# Patient Record
Sex: Female | Born: 1945 | Race: White | Hispanic: No | Marital: Married | Smoking: Former smoker
Health system: Southern US, Community
[De-identification: ages and names within clinical notes are randomized; demographics above are authoritative.]

## PROBLEM LIST (undated history)

## (undated) DIAGNOSIS — C801 Malignant (primary) neoplasm, unspecified: Secondary | ICD-10-CM

## (undated) DIAGNOSIS — E782 Mixed hyperlipidemia: Secondary | ICD-10-CM

## (undated) DIAGNOSIS — Z8719 Personal history of other diseases of the digestive system: Secondary | ICD-10-CM

## (undated) DIAGNOSIS — D649 Anemia, unspecified: Secondary | ICD-10-CM

## (undated) DIAGNOSIS — I10 Essential (primary) hypertension: Secondary | ICD-10-CM

## (undated) DIAGNOSIS — K219 Gastro-esophageal reflux disease without esophagitis: Secondary | ICD-10-CM

## (undated) DIAGNOSIS — M1990 Unspecified osteoarthritis, unspecified site: Secondary | ICD-10-CM

## (undated) HISTORY — PX: ESOPHAGOGASTRODUODENOSCOPY: SHX1529

## (undated) HISTORY — PX: APPENDECTOMY: SHX54

## (undated) HISTORY — PX: TUBAL LIGATION: SHX77

## (undated) HISTORY — PX: OOPHORECTOMY: SHX86

## (undated) HISTORY — PX: ABDOMINAL HYSTERECTOMY: SHX81

## (undated) HISTORY — PX: COLONOSCOPY: SHX174

## (undated) HISTORY — PX: CATARACT EXTRACTION: SUR2

---

## 2019-12-20 ENCOUNTER — Ambulatory Visit: Payer: Self-pay | Attending: Internal Medicine

## 2019-12-20 DIAGNOSIS — Z23 Encounter for immunization: Secondary | ICD-10-CM | POA: Insufficient documentation

## 2019-12-20 NOTE — Progress Notes (Signed)
   Covid-19 Vaccination Clinic  Name:  Amber Farrell    MRN: [id number] DOB: [birth year]  12/20/2019  Amber Farrell was observed post Covid-19 immunization for 15 minutes without incidence. She was provided with Vaccine Information Sheet and instruction to access the V-Safe system.   Amber Farrell was instructed to call 911 with any severe reactions post vaccine: Marland Kitchen Difficulty breathing  . Swelling of your face and throat  . A fast heartbeat  . A bad rash all over your body  . Dizziness and weakness    Immunizations Administered    Name Date Dose VIS Date Route   Moderna COVID-19 Vaccine 12/20/2019 10:51 AM 0.5 mL 09/28/2019 Intramuscular   Manufacturer: Moderna   Lot: [id number]   [place]:[id number]

## 2020-01-18 ENCOUNTER — Ambulatory Visit: Payer: Medicare PPO | Attending: Internal Medicine

## 2020-01-18 DIAGNOSIS — Z23 Encounter for immunization: Secondary | ICD-10-CM

## 2020-01-18 NOTE — Progress Notes (Signed)
   Covid-19 Vaccination Clinic  Name:  Amber Farrell    MRN: [id number] DOB: 08/01/1946  01/18/2020  Ms. Amber Farrell was observed post Covid-19 immunization for 15 minutes without incident. She was provided with Vaccine Information Sheet and instruction to access the V-Safe system.   Ms. Amber Farrell was instructed to call 911 with any severe reactions post vaccine: Marland Kitchen Difficulty breathing  . Swelling of face and throat  . A fast heartbeat  . A bad rash all over body  . Dizziness and weakness   Immunizations Administered    Name Date Dose VIS Date Route   Moderna COVID-19 Vaccine 01/18/2020 10:10 AM 0.5 mL 09/28/2019 Intramuscular   Manufacturer: Levan Hurst   LotUT:[id number]   [place]:[id number]

## 2020-07-26 ENCOUNTER — Other Ambulatory Visit: Payer: Medicare PPO

## 2020-07-26 DIAGNOSIS — Z20822 Contact with and (suspected) exposure to covid-19: Secondary | ICD-10-CM

## 2020-07-27 LAB — SARS-COV-2, NAA 2 DAY TAT

## 2020-07-27 LAB — NOVEL CORONAVIRUS, NAA: SARS-CoV-2, NAA: NOT DETECTED

## 2020-09-19 ENCOUNTER — Other Ambulatory Visit: Payer: Self-pay | Admitting: Orthopedic Surgery

## 2020-09-19 ENCOUNTER — Encounter: Payer: Self-pay | Admitting: Orthopedic Surgery

## 2020-09-19 NOTE — H&P (Signed)
NAME: Amber Farrell MRN:   [id number] DOB:   01-May-1946     HISTORY AND PHYSICAL  CHIEF COMPLAINT:  Left hip pain  HISTORY:   Amber Farrell a 74 y.o. female  with left  Hip Pain Patient complains of left hip pain. Onset of the symptoms was several years ago. Inciting event: none. The patient reports the hip pain is worse with weight bearing and is aggravated by walking. Associated symptoms: none. Aggravating symptoms include: any weight bearing and going up and down stairs. Patient has had prior hip problems. Previous visits for this problem: yes, last seen several weeks ago by me. Evaluation to date: plain films, which were abnormal  osteoarthritis. Treatment to date: OTC analgesics, which have been somewhat effective, prescription analgesics, which have been somewhat effective, home exercise program, which has been somewhat effective and physical therapy, which has been somewhat effective. Plan for left total hip replacement  PAST MEDICAL HISTORY:  No past medical history on file.  PAST SURGICAL HISTORY:    MEDICATIONS:  (Not in a hospital admission)   ALLERGIES:  No Known Allergies  REVIEW OF SYSTEMS:   Negative except HPI  FAMILY HISTORY:  No family history on file.  SOCIAL HISTORY:   has no history on file for tobacco use, alcohol use, and drug use.  PHYSICAL EXAM:  General appearance: alert, cooperative and no distress Resp: clear to auscultation bilaterally Cardio: regular rate and rhythm, S1, S2 normal, no murmur, click, rub or gallop GI: soft, non-tender; bowel sounds normal; no masses,  no organomegaly Extremities: extremities normal, atraumatic, no cyanosis or edema and Homans sign is negative, no sign of DVT Pulses: 2+ and symmetric Skin: Skin color, texture, turgor normal. No rashes or lesions Neurologic: Alert and oriented X 3, normal strength and tone. Normal symmetric reflexes. Normal coordination and gait    LABORATORY STUDIES: No results for input(s): WBC, HGB, HCT,  PLT in the last 72 hours.  No results for input(s): NA, K, CL, CO2, GLUCOSE, BUN, CREATININE, CALCIUM in the last 72 hours.  STUDIES/RESULTS:  No results found.  ASSESSMENT:  End stage osteoarthritis left hip        Active Problems:   * No active hospital problems. *    PLAN:  Left Primary Total Hip   Carlynn Spry 09/19/2020. 11:44 AM

## 2020-09-20 ENCOUNTER — Encounter
Admission: RE | Admit: 2020-09-20 | Discharge: 2020-09-20 | Disposition: A | Discharge status: Home / Self Care | Payer: Medicare PPO | Source: Ambulatory Visit | Attending: Orthopedic Surgery | Admitting: Orthopedic Surgery

## 2020-09-20 ENCOUNTER — Other Ambulatory Visit: Payer: Self-pay

## 2020-09-20 DIAGNOSIS — Z01818 Encounter for other preprocedural examination: Secondary | ICD-10-CM | POA: Insufficient documentation

## 2020-09-20 DIAGNOSIS — I10 Essential (primary) hypertension: Secondary | ICD-10-CM | POA: Diagnosis not present

## 2020-09-20 DIAGNOSIS — Z01810 Encounter for preprocedural cardiovascular examination: Secondary | ICD-10-CM

## 2020-09-20 HISTORY — DX: Gastro-esophageal reflux disease without esophagitis: K21.9

## 2020-09-20 HISTORY — DX: Personal history of other diseases of the digestive system: Z87.19

## 2020-09-20 HISTORY — DX: Malignant (primary) neoplasm, unspecified: C80.1

## 2020-09-20 HISTORY — DX: Anemia, unspecified: D64.9

## 2020-09-20 HISTORY — DX: Essential (primary) hypertension: I10

## 2020-09-20 LAB — BASIC METABOLIC PANEL
Anion gap: 12 (ref 5–15)
BUN: 14 mg/dL (ref 8–23)
CO2: 29 mmol/L (ref 22–32)
Calcium: 9.9 mg/dL (ref 8.9–10.3)
Chloride: 96 mmol/L — ABNORMAL LOW (ref 98–111)
Creatinine, Ser: 0.72 mg/dL (ref 0.44–1.00)
GFR, Estimated: >60 mL/min (ref >60)
Glucose, Bld: 90 mg/dL (ref 70–99)
Potassium: 3.3 mmol/L — ABNORMAL LOW (ref 3.5–5.1)
Sodium: 137 mmol/L (ref 135–145)

## 2020-09-20 LAB — CBC
HCT: 40.7 % (ref 36.0–46.0)
Hemoglobin: 13.3 g/dL (ref 12.0–15.0)
MCH: 30.9 pg (ref 26.0–34.0)
MCHC: 32.7 g/dL (ref 30.0–36.0)
MCV: 94.4 fL (ref 80.0–100.0)
Platelets: 363 10*3/uL (ref 150–400)
RBC: 4.31 MIL/uL (ref 3.87–5.11)
RDW: 14.1 % (ref 11.5–15.5)
WBC: 4.5 10*3/uL (ref 4.0–10.5)
nRBC: 0 % (ref 0.0–0.2)

## 2020-09-20 LAB — TYPE AND SCREEN
ABO/RH(D): A POS
Antibody Screen: NEGATIVE

## 2020-09-20 LAB — URINALYSIS, ROUTINE W REFLEX MICROSCOPIC
Bilirubin Urine: NEGATIVE
Glucose, UA: NEGATIVE mg/dL
Hgb urine dipstick: NEGATIVE
Ketones, ur: NEGATIVE mg/dL
Leukocytes,Ua: NEGATIVE
Nitrite: NEGATIVE
Protein, ur: NEGATIVE mg/dL
Specific Gravity, Urine: 1.006 (ref 1.005–1.030)
pH: 8 (ref 5.0–8.0)

## 2020-09-20 LAB — SURGICAL PCR SCREEN
MRSA, PCR: NEGATIVE
Staphylococcus aureus: NEGATIVE

## 2020-09-20 LAB — APTT: aPTT: 30 seconds (ref 24–36)

## 2020-09-20 LAB — PROTIME-INR
INR: 1 (ref 0.8–1.2)
Prothrombin Time: 12.4 seconds (ref 11.4–15.2)

## 2020-09-20 NOTE — Patient Instructions (Addendum)
Your procedure is scheduled on: 09/27/20 Report to [facility] AFTER CHECKING IN AT [facility]. To find out your arrival time please call [phone] between 1PM - 3PM on 09/26/20.  Remember: Instructions that are not followed completely may result in serious medical risk, up to and including death, or upon the discretion of your surgeon and anesthesiologist your surgery may need to be rescheduled.     _X__ 1. Do not eat food after midnight the night before your procedure.                 No gum chewing or hard candies. You may drink clear liquids up to 2 hours                 before you are scheduled to arrive for your surgery- DO not drink clear                 liquids within 2 hours of the start of your surgery.                 Clear Liquids include:  water, apple juice without pulp, clear carbohydrate                 drink such as Clearfast or Gatorade, Black Coffee or Tea (Do not add                 anything to coffee or tea). Diabetics water only  __X__2.  On the morning of surgery brush your teeth with toothpaste and water, you                 may rinse your mouth with mouthwash if you wish.  Do not swallow any              toothpaste of mouthwash.     _X__ 3.  No Alcohol for 24 hours before or after surgery.   _X__ 4.  Do Not Smoke or use e-cigarettes For 24 Hours Prior to Your Surgery.                 Do not use any chewable tobacco products for at least 6 hours prior to                 surgery.  ____  5.  Bring all medications with you on the day of surgery if instructed.   __X__  6.  Notify your doctor if there is any change in your medical condition      (cold, fever, infections).     Do not wear jewelry, make-up, hairpins, clips or nail polish. Do not wear lotions, powders, or perfumes.  Do not shave 48 hours prior to surgery. Men may shave face and neck. Do not bring valuables to the hospital.    [facility]  is not responsible for any belongings or valuables.  Contacts, dentures/partials or body piercings may not be worn into surgery. Bring a case for your contacts, glasses or hearing aids, a denture cup will be supplied. Leave your suitcase in the car. After surgery it may be brought to your room. For patients admitted to the hospital, discharge time is determined by your treatment team.   Patients discharged the day of surgery will not be allowed to drive home.   Please read over the following fact sheets that you were given:   MRSA Information  __X__ Take these medicines the morning of surgery  with A SIP OF WATER:    1. loratadine (CLARITIN) 10 MG tablet  2. gabapentin (NEURONTIN) 600 MG capsule  3. omeprazole (PRILOSEC) 20 MG capsule  4.  5.  6.  ____ Fleet Enema (as directed)   __X__ Use CHG Soap/SAGE wipes as directed  ____ Use inhalers on the day of surgery  ____ Stop metformin/Janumet/Farxiga 2 days prior to surgery    ____ Take 1/2 of usual insulin dose the night before surgery. No insulin the morning          of surgery.   ____ Stop Blood Thinners Coumadin/Plavix/Xarelto/Pleta/Pradaxa/Eliquis/Effient/Aspirin  on   Or contact your Surgeon, Cardiologist or Medical Doctor regarding  ability to stop your blood thinners  __X__ Stop Anti-inflammatories 7 days before surgery such as Advil, Ibuprofen, Motrin,  BC or Goodies Powder, Naprosyn, Naproxen, Aleve, Aspirin   OK TO USE TYLENOL  __X__ Stop all herbal supplements, fish oil or vitamin E until after surgery.  STOP RE;IEF FACTOR TODAY 09/20/20  ____ Bring C-Pap to the hospital.     How to Use an Incentive Spirometer An incentive spirometer is a tool that measures how well you are filling your lungs with each breath. Learning to take long, deep breaths using this tool can help you keep your lungs clear and active. This may help to reverse or lessen your chance of developing breathing (pulmonary) problems, especially  infection. You may be asked to use a spirometer:  After a surgery.  If you have a lung problem or a history of smoking.  After a long period of time when you have been unable to move or be active. If the spirometer includes an indicator to show the highest number that you have reached, your health care provider or respiratory therapist will help you set a goal. Keep a list (log) of your progress as told by your health care provider. What are the risks?  Breathing too quickly may cause dizziness or cause you to pass out. Take your time so you do not get dizzy or light-headed.  If you are in pain, you may need to take pain medicine before doing incentive spirometry. It is harder to take a deep breath if you are having pain. How to use your incentive spirometer  1. Sit up on the edge of your bed or on a chair. 2. Hold the incentive spirometer so that it is in an upright position. 3. Before you use the spirometer, breathe out normally. 4. Place the mouthpiece in your mouth. Make sure your lips are closed tightly around it. 5. Breathe in slowly and as deeply as you can through your mouth, causing the piston or the ball to rise toward the top of the chamber. 6. Hold your breath for 3-5 seconds, or for as long as possible. ? If the spirometer includes a coach indicator, use this to guide you in breathing. Slow down your breathing if the indicator goes above the marked areas. 7. Remove the mouthpiece from your mouth and breathe out normally. The piston or ball will return to the bottom of the chamber. 8. Rest for a few seconds, then repeat the steps 10 or more times. ? Take your time and take a few normal breaths between deep breaths so that you do not get dizzy or light-headed. ? Do this every 1-2 hours when you are awake. 9. If the spirometer includes a goal marker to show the highest number you have reached (best effort), use this as a goal to  work toward during each repetition. 10. After each  set of 10 deep breaths, cough a few times. This will help to make sure that your lungs are clear. ? If you have an incision on your chest or abdomen from surgery, place a pillow or a rolled-up towel firmly against the incision when you cough. This can help to reduce pain from coughing. General tips  When you become able to get out of bed, walk around often and continue to cough to help clear your lungs.  Keep using the incentive spirometer until your health care provider says it is okay to stop using it. If you have been in the hospital, you may be told to keep using the spirometer at home. Contact a health care provider if:  You are having difficulty using the spirometer.  You have trouble using the spirometer as often as instructed.  Your pain medicine is not giving enough relief for you to use the spirometer as told.  You have a fever.  You develop shortness of breath. Get help right away if:  You develop a cough with bloody mucus from the lungs (bloody sputum).  You have fluid or blood coming from an incision site after you cough. Summary  An incentive spirometer is a tool that can help you learn to take long, deep breaths to keep your lungs clear and active.  You may be asked to use a spirometer after a surgery, if you have a lung problem or a history of smoking, or if you have been inactive for a long period of time.  Use your incentive spirometer as instructed every 1-2 hours while you are awake.  If you have an incision on your chest or abdomen, place a pillow or a rolled-up towel firmly against your incision when you cough. This will help to reduce pain. This information is not intended to replace advice given to you by your health care provider. Make sure you discuss any questions you have with your health care provider. Document Revised: 05/14/2019 Document Reviewed: 08/27/2017 Elsevier Patient Education  2020 [employer].

## 2020-09-25 ENCOUNTER — Other Ambulatory Visit
Admission: RE | Admit: 2020-09-25 | Discharge: 2020-09-25 | Disposition: A | Discharge status: Home / Self Care | Payer: Medicare PPO | Source: Ambulatory Visit | Attending: Orthopedic Surgery | Admitting: Orthopedic Surgery

## 2020-09-25 ENCOUNTER — Other Ambulatory Visit: Payer: Self-pay

## 2020-09-25 DIAGNOSIS — Z01812 Encounter for preprocedural laboratory examination: Secondary | ICD-10-CM | POA: Diagnosis present

## 2020-09-25 DIAGNOSIS — Z20822 Contact with and (suspected) exposure to covid-19: Secondary | ICD-10-CM | POA: Insufficient documentation

## 2020-09-25 LAB — SARS CORONAVIRUS 2 (TAT 6-24 HRS): SARS Coronavirus 2: NEGATIVE

## 2020-09-26 MED ORDER — LACTATED RINGERS IV SOLN: INTRAVENOUS | Status: DC

## 2020-09-26 MED ORDER — TRANEXAMIC ACID-NACL 1000-0.7 MG/100ML-% IV SOLN
1000.0000 mg | INTRAVENOUS | Status: AC
  Administered 2020-09-27: 1000 mg via INTRAVENOUS

## 2020-09-26 MED ORDER — CEFAZOLIN SODIUM-DEXTROSE 2-4 GM/100ML-% IV SOLN
2.0000 g | INTRAVENOUS | Status: AC
  Administered 2020-09-27: 2 g via INTRAVENOUS

## 2020-09-26 MED ORDER — CHLORHEXIDINE GLUCONATE 0.12 % MT SOLN: 15.0000 mL | Freq: Once | OROMUCOSAL | Status: AC

## 2020-09-26 MED ORDER — ORAL CARE MOUTH RINSE: 15.0000 mL | Freq: Once | OROMUCOSAL | Status: AC

## 2020-09-27 ENCOUNTER — Ambulatory Visit: Payer: Medicare PPO | Admitting: Certified Registered"

## 2020-09-27 ENCOUNTER — Other Ambulatory Visit: Payer: Self-pay

## 2020-09-27 ENCOUNTER — Ambulatory Visit: Payer: Medicare PPO

## 2020-09-27 ENCOUNTER — Encounter
Admission: RE | Disposition: A | Discharge status: Home / Self Care | Payer: Self-pay | Source: Home / Self Care | Attending: Orthopedic Surgery

## 2020-09-27 ENCOUNTER — Observation Stay
Admission: RE | Admit: 2020-09-27 | Discharge: 2020-09-28 | Disposition: A | Discharge status: Home / Self Care | Payer: Medicare PPO | Attending: Orthopedic Surgery | Admitting: Orthopedic Surgery

## 2020-09-27 ENCOUNTER — Encounter: Payer: Self-pay | Admitting: Orthopedic Surgery

## 2020-09-27 DIAGNOSIS — I10 Essential (primary) hypertension: Secondary | ICD-10-CM | POA: Diagnosis not present

## 2020-09-27 DIAGNOSIS — Z79899 Other long term (current) drug therapy: Secondary | ICD-10-CM | POA: Insufficient documentation

## 2020-09-27 DIAGNOSIS — M1612 Unilateral primary osteoarthritis, left hip: Secondary | ICD-10-CM | POA: Diagnosis present

## 2020-09-27 DIAGNOSIS — Z419 Encounter for procedure for purposes other than remedying health state, unspecified: Secondary | ICD-10-CM

## 2020-09-27 DIAGNOSIS — Z96642 Presence of left artificial hip joint: Secondary | ICD-10-CM

## 2020-09-27 HISTORY — PX: TOTAL HIP ARTHROPLASTY: SHX124

## 2020-09-27 LAB — ABO/RH: ABO/RH(D): A POS

## 2020-09-27 SURGERY — ARTHROPLASTY, HIP, TOTAL, ANTERIOR APPROACH
Anesthesia: Spinal | Site: Hip | Laterality: Left

## 2020-09-27 MED ORDER — PANTOPRAZOLE SODIUM 40 MG PO TBEC
40.0000 mg | DELAYED_RELEASE_TABLET | Freq: Every day | ORAL | Status: DC
  Administered 2020-09-28: 40 mg via ORAL
  Filled 2020-09-27: qty 1

## 2020-09-27 MED ORDER — BUPIVACAINE HCL (PF) 0.5 % IJ SOLN
INTRAMUSCULAR | Status: DC | PRN
  Administered 2020-09-27: 2.5 mL via INTRATHECAL

## 2020-09-27 MED ORDER — HYDROCODONE-ACETAMINOPHEN 5-325 MG PO TABS
1.0000 | ORAL_TABLET | ORAL | Status: DC | PRN
  Administered 2020-09-27 – 2020-09-28 (×2): 1 via ORAL
  Filled 2020-09-27: qty 2
  Filled 2020-09-27 (×2): qty 1

## 2020-09-27 MED ORDER — ONDANSETRON HCL 4 MG PO TABS: 4.0000 mg | ORAL_TABLET | Freq: Four times a day (QID) | ORAL | Status: DC | PRN

## 2020-09-27 MED ORDER — MENTHOL 3 MG MT LOZG
1.0000 | LOZENGE | OROMUCOSAL | Status: DC | PRN
  Filled 2020-09-27: qty 9

## 2020-09-27 MED ORDER — GLYCOPYRROLATE 0.2 MG/ML IJ SOLN
INTRAMUSCULAR | Status: DC | PRN
  Administered 2020-09-27: .2 mg via INTRAVENOUS

## 2020-09-27 MED ORDER — TRANEXAMIC ACID-NACL 1000-0.7 MG/100ML-% IV SOLN
INTRAVENOUS | Status: AC
  Filled 2020-09-27: qty 100

## 2020-09-27 MED ORDER — ONDANSETRON HCL 4 MG/2ML IJ SOLN: 4.0000 mg | Freq: Four times a day (QID) | INTRAMUSCULAR | Status: DC | PRN

## 2020-09-27 MED ORDER — PHENYLEPHRINE HCL (PRESSORS) 10 MG/ML IV SOLN
INTRAVENOUS | Status: DC | PRN
  Administered 2020-09-27 (×3): 100 ug via INTRAVENOUS

## 2020-09-27 MED ORDER — MIDAZOLAM HCL 2 MG/2ML IJ SOLN
INTRAMUSCULAR | Status: AC
  Filled 2020-09-27: qty 2

## 2020-09-27 MED ORDER — HYDROCODONE-ACETAMINOPHEN 7.5-325 MG PO TABS: 1.0000 | ORAL_TABLET | ORAL | Status: DC | PRN

## 2020-09-27 MED ORDER — METOCLOPRAMIDE HCL 5 MG/ML IJ SOLN: 5.0000 mg | Freq: Three times a day (TID) | INTRAMUSCULAR | Status: DC | PRN

## 2020-09-27 MED ORDER — HYDROCHLOROTHIAZIDE 25 MG PO TABS
25.0000 mg | ORAL_TABLET | Freq: Every day | ORAL | Status: DC
  Administered 2020-09-28: 25 mg via ORAL
  Filled 2020-09-27: qty 1

## 2020-09-27 MED ORDER — CHLORHEXIDINE GLUCONATE 0.12 % MT SOLN
OROMUCOSAL | Status: AC
  Administered 2020-09-27: 15 mL via OROMUCOSAL
  Filled 2020-09-27: qty 15

## 2020-09-27 MED ORDER — MIDAZOLAM HCL 5 MG/5ML IJ SOLN
INTRAMUSCULAR | Status: DC | PRN
  Administered 2020-09-27 (×2): 1 mg via INTRAVENOUS

## 2020-09-27 MED ORDER — AMLODIPINE BESYLATE 5 MG PO TABS
2.5000 mg | ORAL_TABLET | Freq: Every evening | ORAL | Status: DC
  Administered 2020-09-27 – 2020-09-28 (×2): 2.5 mg via ORAL
  Filled 2020-09-27 (×2): qty 1

## 2020-09-27 MED ORDER — ACETAMINOPHEN 325 MG PO TABS
325.0000 mg | ORAL_TABLET | Freq: Four times a day (QID) | ORAL | Status: DC | PRN
  Administered 2020-09-28: 325 mg via ORAL

## 2020-09-27 MED ORDER — GLYCOPYRROLATE 0.2 MG/ML IJ SOLN
INTRAMUSCULAR | Status: AC
  Filled 2020-09-27: qty 1

## 2020-09-27 MED ORDER — FENTANYL CITRATE (PF) 100 MCG/2ML IJ SOLN
INTRAMUSCULAR | Status: AC
  Filled 2020-09-27: qty 2

## 2020-09-27 MED ORDER — MAGNESIUM CITRATE PO SOLN
1.0000 | Freq: Once | ORAL | Status: DC | PRN
  Filled 2020-09-27: qty 296

## 2020-09-27 MED ORDER — ASPIRIN 81 MG PO CHEW
81.0000 mg | CHEWABLE_TABLET | Freq: Two times a day (BID) | ORAL | Status: DC
  Administered 2020-09-27 – 2020-09-28 (×3): 81 mg via ORAL
  Filled 2020-09-27 (×2): qty 1

## 2020-09-27 MED ORDER — PROPOFOL 10 MG/ML IV BOLUS
INTRAVENOUS | Status: AC
  Filled 2020-09-27: qty 20

## 2020-09-27 MED ORDER — FENTANYL CITRATE (PF) 100 MCG/2ML IJ SOLN
INTRAMUSCULAR | Status: AC
  Administered 2020-09-27: 25 ug via INTRAVENOUS
  Filled 2020-09-27: qty 2

## 2020-09-27 MED ORDER — PHENYLEPHRINE HCL (PRESSORS) 10 MG/ML IV SOLN: INTRAVENOUS | Status: DC | PRN

## 2020-09-27 MED ORDER — GABAPENTIN 300 MG PO CAPS: 300.0000 mg | ORAL_CAPSULE | ORAL | Status: DC

## 2020-09-27 MED ORDER — BUPIVACAINE HCL (PF) 0.5 % IJ SOLN
INTRAMUSCULAR | Status: AC
  Filled 2020-09-27: qty 10

## 2020-09-27 MED ORDER — POVIDONE-IODINE 10 % EX SWAB: 2.0000 "application " | Freq: Once | CUTANEOUS | Status: DC

## 2020-09-27 MED ORDER — BISACODYL 10 MG RE SUPP: 10.0000 mg | Freq: Every day | RECTAL | Status: DC | PRN

## 2020-09-27 MED ORDER — PHENOL 1.4 % MT LIQD
1.0000 | OROMUCOSAL | Status: DC | PRN
  Filled 2020-09-27: qty 177

## 2020-09-27 MED ORDER — GABAPENTIN 300 MG PO CAPS
300.0000 mg | ORAL_CAPSULE | Freq: Two times a day (BID) | ORAL | Status: DC
  Administered 2020-09-27 – 2020-09-28 (×2): 300 mg via ORAL
  Filled 2020-09-27 (×2): qty 1

## 2020-09-27 MED ORDER — METOCLOPRAMIDE HCL 10 MG PO TABS: 5.0000 mg | ORAL_TABLET | Freq: Three times a day (TID) | ORAL | Status: DC | PRN

## 2020-09-27 MED ORDER — LIDOCAINE HCL (PF) 2 % IJ SOLN
INTRAMUSCULAR | Status: AC
  Filled 2020-09-27: qty 5

## 2020-09-27 MED ORDER — PROPOFOL 500 MG/50ML IV EMUL
INTRAVENOUS | Status: DC | PRN
  Administered 2020-09-27: 100 ug/kg/min via INTRAVENOUS

## 2020-09-27 MED ORDER — ONDANSETRON HCL 4 MG/2ML IJ SOLN: 4.0000 mg | Freq: Once | INTRAMUSCULAR | Status: DC | PRN

## 2020-09-27 MED ORDER — KETOROLAC TROMETHAMINE 15 MG/ML IJ SOLN
7.5000 mg | Freq: Four times a day (QID) | INTRAMUSCULAR | Status: AC
  Administered 2020-09-27 – 2020-09-28 (×2): 7.5 mg via INTRAVENOUS
  Filled 2020-09-27 (×3): qty 1

## 2020-09-27 MED ORDER — LACTATED RINGERS IV SOLN: INTRAVENOUS | Status: DC

## 2020-09-27 MED ORDER — PROPOFOL 500 MG/50ML IV EMUL
INTRAVENOUS | Status: AC
  Filled 2020-09-27: qty 50

## 2020-09-27 MED ORDER — LOSARTAN POTASSIUM 50 MG PO TABS
100.0000 mg | ORAL_TABLET | Freq: Every evening | ORAL | Status: DC
  Administered 2020-09-28: 100 mg via ORAL
  Filled 2020-09-27: qty 2

## 2020-09-27 MED ORDER — PROPOFOL 10 MG/ML IV BOLUS
INTRAVENOUS | Status: DC | PRN
  Administered 2020-09-27: 40 mg via INTRAVENOUS
  Administered 2020-09-27: 20 mg via INTRAVENOUS

## 2020-09-27 MED ORDER — ALUM & MAG HYDROXIDE-SIMETH 200-200-20 MG/5ML PO SUSP: 30.0000 mL | ORAL | Status: DC | PRN

## 2020-09-27 MED ORDER — MAGNESIUM HYDROXIDE 400 MG/5ML PO SUSP
30.0000 mL | Freq: Every day | ORAL | Status: DC | PRN
  Administered 2020-09-28: 30 mL via ORAL
  Filled 2020-09-27: qty 30

## 2020-09-27 MED ORDER — CEFAZOLIN SODIUM-DEXTROSE 1-4 GM/50ML-% IV SOLN
1.0000 g | Freq: Four times a day (QID) | INTRAVENOUS | Status: AC
  Administered 2020-09-27 – 2020-09-28 (×2): 1 g via INTRAVENOUS
  Filled 2020-09-27 (×4): qty 50

## 2020-09-27 MED ORDER — GABAPENTIN 600 MG PO TABS
600.0000 mg | ORAL_TABLET | Freq: Every day | ORAL | Status: DC
  Administered 2020-09-28: 600 mg via ORAL
  Filled 2020-09-27 (×2): qty 1

## 2020-09-27 MED ORDER — DOCUSATE SODIUM 100 MG PO CAPS
100.0000 mg | ORAL_CAPSULE | Freq: Two times a day (BID) | ORAL | Status: DC
  Administered 2020-09-27 – 2020-09-28 (×2): 100 mg via ORAL
  Filled 2020-09-27 (×3): qty 1

## 2020-09-27 MED ORDER — FERROUS GLUCONATE 324 (38 FE) MG PO TABS
324.0000 mg | ORAL_TABLET | Freq: Every day | ORAL | Status: DC
  Administered 2020-09-28: 324 mg via ORAL
  Filled 2020-09-27: qty 1

## 2020-09-27 MED ORDER — MORPHINE SULFATE (PF) 2 MG/ML IV SOLN: 0.5000 mg | INTRAVENOUS | Status: DC | PRN

## 2020-09-27 MED ORDER — LORATADINE 10 MG PO TABS
10.0000 mg | ORAL_TABLET | Freq: Every day | ORAL | Status: DC
  Administered 2020-09-28: 10 mg via ORAL
  Filled 2020-09-27: qty 1

## 2020-09-27 MED ORDER — CEFAZOLIN SODIUM-DEXTROSE 2-4 GM/100ML-% IV SOLN
INTRAVENOUS | Status: AC
  Filled 2020-09-27: qty 100

## 2020-09-27 MED ORDER — FENTANYL CITRATE (PF) 100 MCG/2ML IJ SOLN
25.0000 ug | INTRAMUSCULAR | Status: DC | PRN
  Administered 2020-09-27 (×3): 25 ug via INTRAVENOUS

## 2020-09-27 SURGICAL SUPPLY — 54 items
BLADE SAGITTAL WIDE XTHICK NO (BLADE) ×3 IMPLANT
BRUSH SCRUB EZ  4% CHG (MISCELLANEOUS) ×4
BRUSH SCRUB EZ 4% CHG (MISCELLANEOUS) ×2 IMPLANT
CHLORAPREP W/TINT 26 (MISCELLANEOUS) ×3 IMPLANT
COVER HOLE (Hips) ×2 IMPLANT
COVER WAND RF STERILE (DRAPES) ×3 IMPLANT
CUP R3 50MM (Hips) ×2 IMPLANT
DRAPE 3/4 80X56 (DRAPES) ×3 IMPLANT
DRAPE C-ARM 42X72 X-RAY (DRAPES) ×3 IMPLANT
DRAPE STERI IOBAN 125X83 (DRAPES) IMPLANT
DRSG AQUACEL AG ADV 3.5X10 (GAUZE/BANDAGES/DRESSINGS) IMPLANT
DRSG AQUACEL AG ADV 3.5X14 (GAUZE/BANDAGES/DRESSINGS) IMPLANT
ELECT BLADE 6.5 EXT (BLADE) ×3 IMPLANT
ELECT REM PT RETURN 9FT ADLT (ELECTROSURGICAL) ×3
ELECTRODE REM PT RTRN 9FT ADLT (ELECTROSURGICAL) ×1 IMPLANT
GAUZE XEROFORM 1X8 LF (GAUZE/BANDAGES/DRESSINGS) IMPLANT
GLOVE INDICATOR 8.0 STRL GRN (GLOVE) ×3 IMPLANT
GLOVE SURG ORTHO 8.0 STRL STRW (GLOVE) ×6 IMPLANT
GOWN STRL REUS W/ TWL LRG LVL3 (GOWN DISPOSABLE) ×1 IMPLANT
GOWN STRL REUS W/ TWL XL LVL3 (GOWN DISPOSABLE) ×1 IMPLANT
GOWN STRL REUS W/TWL LRG LVL3 (GOWN DISPOSABLE) ×2
GOWN STRL REUS W/TWL XL LVL3 (GOWN DISPOSABLE) ×2
HEAD OXINIUM PLUS 0 32MM (Hips) ×2 IMPLANT
HOOD PEEL AWAY FLYTE STAYCOOL (MISCELLANEOUS) ×9 IMPLANT
IRRIGATION SURGIPHOR STRL (IV SOLUTION) IMPLANT
IV NS 1000ML (IV SOLUTION) ×2
IV NS 1000ML BAXH (IV SOLUTION) ×1 IMPLANT
KIT PATIENT CARE HANA TABLE (KITS) ×3 IMPLANT
KIT TURNOVER CYSTO (KITS) ×3 IMPLANT
LINER 0 DEG 32X50MM (Hips) ×2 IMPLANT
MANIFOLD NEPTUNE II (INSTRUMENTS) ×3 IMPLANT
MAT ABSORB  FLUID 56X50 GRAY (MISCELLANEOUS) ×2
MAT ABSORB FLUID 56X50 GRAY (MISCELLANEOUS) ×1 IMPLANT
NDL SAFETY ECLIPSE 18X1.5 (NEEDLE) ×2 IMPLANT
NDL SPNL 20GX3.5 QUINCKE YW (NEEDLE) ×1 IMPLANT
NDL SPNL 22GX3.5 QUINCKE BK (NEEDLE) IMPLANT
NEEDLE HYPO 18GX1.5 SHARP (NEEDLE) ×4
NEEDLE HYPO 22GX1.5 SAFETY (NEEDLE) ×3 IMPLANT
NEEDLE SPNL 20GX3.5 QUINCKE YW (NEEDLE) ×3 IMPLANT
NEEDLE SPNL 22GX3.5 QUINCKE BK (NEEDLE) ×3 IMPLANT
PACK HIP PROSTHESIS (MISCELLANEOUS) ×3 IMPLANT
PADDING CAST BLEND 4X4 NS (MISCELLANEOUS) ×6 IMPLANT
PILLOW ABDUCTION MEDIUM (MISCELLANEOUS) ×3 IMPLANT
PULSAVAC PLUS IRRIG FAN TIP (DISPOSABLE) ×3
SCREW 6.5X25MM (Screw) ×2 IMPLANT
STAPLER SKIN PROX 35W (STAPLE) ×3 IMPLANT
STEM LATERAL COLLAR POLARSTEM (Stem) ×2 IMPLANT
SUT BONE WAX W31G (SUTURE) ×3 IMPLANT
SUT DVC 2 QUILL PDO  T11 36X36 (SUTURE) ×2
SUT DVC 2 QUILL PDO T11 36X36 (SUTURE) ×1 IMPLANT
SUT VIC AB 2-0 CT1 18 (SUTURE) ×3 IMPLANT
SYR 20ML LL LF (SYRINGE) ×3 IMPLANT
TIP FAN IRRIG PULSAVAC PLUS (DISPOSABLE) ×1 IMPLANT
WAND WEREWOLF FASTSEAL 6.0 (MISCELLANEOUS) ×2 IMPLANT

## 2020-09-27 NOTE — Op Note (Signed)
09/27/2020  12:57 PM  PATIENT:  Amber Farrell   MRN: [id number]  PRE-OPERATIVE DIAGNOSIS:  Osteoarthritis left hip   POST-OPERATIVE DIAGNOSIS: Same  Procedure: Left Total Hip Replacement  Surgeon: Elyn Aquas. Harlow Mares, MD   Assist: Carlynn Spry, PA-C  Anesthesia: Spinal   EBL: 125 mL   Specimens: None   Drains: None   Components used: A size 2 lateral Polarstem [employer]hew, R3 size 50 mm shell, and a 32 mm +0 mm head    Description of the procedure in detail: After informed consent was obtained and the appropriate extremity marked in the pre-operative holding area, the patient was taken to the operating room and placed in the supine position on the fracture table. All pressure points were well padded and bilateral lower extremities were place in traction spars. The hip was prepped and draped in standard sterile fashion. A spinal anesthetic had been delivered by the anesthesia team. The skin and subcutaneous tissues were injected with a mixture of Marcaine with epinephrine for post-operative pain. A longitudinal incision approximately 10 cm in length was carried out from the anterior superior iliac spine to the greater trochanter. The tensor fascia was divided and blunt dissection was taken down to the level of the joint capsule. The lateral circumflex vessels were cauterized. Deep retractors were placed and a portion of the anterior capsule was excised. Using fluoroscopy the neck cut was planned and carried out with a sagittal saw. The head was passed from the field with use of a corkscrew and hip skid. Deep retractors were placed along the acetabulum and the degenerative labrum and large osteophytes were removed with a Rongeur. The cup was sequentially reamed to a size 50 mm. The wound was irrigated and using fluoroscopy the size 50 mm cup was impacted in to anatomic position. A single screw was placed followed by a threaded hole cover. The final liner was impacted in to position.  Attention was then turned to the proximal femur. The leg was placed in extension and external rotation. The canal was opened and sequentially broached to a size 2 lateral. The trial components were placed and the hip relocated. The components were found to be in good position using fluoroscopy. The hip was dislocated and the trial components removed. The final components were impacted in to position and the hip relocated. The final components were again check with fluoroscopy and found to be in good position. Hemostasis was achieved with electrocautery. The deep capsule was injected with Marcaine and epinephrine. The wound was irrigated with bacitracin laced normal saline and the tensor fascia closed with #2 Quill suture. The subcutaneous tissues were closed with 2-0 vicryl and staples for the skin. A sterile dressing was applied and an abduction pillow. Patient tolerated the procedure well and there were no apparent complication. Patient was taken to the recovery room in good condition.   Kurtis Bushman, MD

## 2020-09-27 NOTE — Anesthesia Preprocedure Evaluation (Signed)
Anesthesia Evaluation  Patient identified by MRN, date of birth, ID band Patient awake    Reviewed: Allergy & Precautions, H&P , NPO status , Patient's Chart, lab work & pertinent test results, reviewed documented beta blocker date and time   History of Anesthesia Complications Negative for: history of anesthetic complications  Airway Mallampati: I  TM Distance: >3 FB Neck ROM: full    Dental no notable dental hx. (+) Edentulous Upper, Edentulous Lower, Upper Dentures, Lower Dentures, Dental Advidsory Given   Pulmonary neg pulmonary ROS, former smoker,    Pulmonary exam normal breath sounds clear to auscultation       Cardiovascular Exercise Tolerance: Good hypertension, (-) angina(-) Past MI Normal cardiovascular exam(-) dysrhythmias (-) Valvular Problems/Murmurs Rhythm:regular Rate:Normal     Neuro/Psych negative neurological ROS  negative psych ROS   GI/Hepatic Neg liver ROS, hiatal hernia, GERD  ,  Endo/Other  negative endocrine ROS  Renal/GU negative Renal ROS  negative genitourinary   Musculoskeletal   Abdominal   Peds  Hematology negative hematology ROS (+)   Anesthesia Other Findings Past Medical History: No date: Anemia No date: Cancer ([place])     Comment:  uterine No date: GERD (gastroesophageal reflux disease) No date: History of hiatal hernia No date: Hypertension   Reproductive/Obstetrics negative OB ROS                             Anesthesia Physical Anesthesia Plan  ASA: II  Anesthesia Plan: Spinal   Post-op Pain Management:    Induction:   PONV Risk Score and Plan: 2 and Propofol infusion and TIVA  Airway Management Planned: Natural Airway and Simple Face Mask  Additional Equipment:   Intra-op Plan:   Post-operative Plan:   Informed Consent: I have reviewed the patients History and Physical, chart, labs and discussed the procedure including the risks,  benefits and alternatives for the proposed anesthesia with the patient or authorized representative who has indicated his/her understanding and acceptance.     Dental Advisory Given  Plan Discussed with: Anesthesiologist, CRNA and Surgeon  Anesthesia Plan Comments:         Anesthesia Quick Evaluation

## 2020-09-27 NOTE — Evaluation (Signed)
Physical Therapy Evaluation Patient Details Name: Amber Farrell MRN: [id number] DOB: May 30, 1946 Today's Date: 09/27/2020   History of Present Illness  Pt is a 74 yo female diagnosed with L hip OA and is s/p elective L THR.  PMH includes: HTN, hernia, GERD, and uterine CA.    Clinical Impression  Pt was pleasant and motivated to participate during the session and performed very well considering POD#0 status.  Pt required very little assistance with bed mobility tasks and no assist to stand.  Pt was able to take several small steps at the EOB before needing to return to sitting secondary to dizziness and nausea.  BP taken in sitting at 88/61, nsg notified.  Pt reported symptoms resolved quickly once back in sitting.  Pt is expected to make good progress towards goals once orthostatic symptoms resolve and will benefit from HHPT services upon discharge to safely address deficits listed in patient problem list for decreased caregiver assistance and eventual return to PLOF.      Follow Up Recommendations Home health PT;Supervision for mobility/OOB    Equipment Recommendations  Rolling walker with 5" wheels    Recommendations for Other Services       Precautions / Restrictions Precautions Precautions: Fall;Anterior Hip Precaution Booklet Issued: Yes (comment) Restrictions Weight Bearing Restrictions: Yes LLE Weight Bearing: Weight bearing as tolerated      Mobility  Bed Mobility Overal bed mobility: Needs Assistance Bed Mobility: Supine to Sit;Sit to Supine     Supine to sit: Supervision Sit to supine: Min assist   General bed mobility comments: Min A for LLE support during sit to sup only, SBA during sup to sit    Transfers Overall transfer level: Needs assistance Equipment used: Rolling walker (2 wheeled) Transfers: Sit to/from Stand Sit to Stand: Min guard         General transfer comment: Min verbal cues for sequencing with good eccentric and concentric  control  Ambulation/Gait Ambulation/Gait assistance: Min guard Gait Distance (Feet): 2 Feet Assistive device: Rolling walker (2 wheeled) Gait Pattern/deviations: Step-to pattern;Decreased step length - right;Decreased stance time - left Gait velocity: decreased   General Gait Details: Pt steady with amb but limited secondary to onset of moderate dizziness and nausea, pt returned to sitting with BP taken at 88/61, nursing aware  Stairs            Wheelchair Mobility    Modified Rankin (Stroke Patients Only)       Balance Overall balance assessment: Needs assistance   Sitting balance-Leahy Scale: Normal     Standing balance support: Bilateral upper extremity supported;During functional activity Standing balance-Leahy Scale: Good                               Pertinent Vitals/Pain Pain Assessment: 0-10 Pain Score: 5  Pain Location: L thigh Pain Descriptors / Indicators: Sore;Aching Pain Intervention(s): Premedicated before session;Monitored during session    Home Living Family/patient expects to be discharged to:: Private residence Living Arrangements: Spouse/significant other Available Help at Discharge: Family;Available 24 hours/day Type of Home: House Home Access: Stairs to enter Entrance Stairs-Rails: Right Entrance Stairs-Number of Steps: 2 Home Layout: One level Home Equipment: Cane - single point;Bedside commode      Prior Function Level of Independence: Independent         Comments: Ind amb community distances without an AD, no fall history, walks 2 miles/day 3 days/wk, Ind with ADLs  Hand Dominance        Extremity/Trunk Assessment   Upper Extremity Assessment Upper Extremity Assessment: Overall WFL for tasks assessed    Lower Extremity Assessment Lower Extremity Assessment: Generalized weakness;RLE deficits/detail;LLE deficits/detail RLE Deficits / Details: BLE ankle strength and AROM WNL; RLE strength grossly WNL RLE  Sensation: WNL RLE Coordination: WNL LLE Deficits / Details: BLE ankle strength and AROM WNL LLE: Unable to fully assess due to pain LLE Sensation: WNL       Communication   Communication: No difficulties  Cognition Arousal/Alertness: Awake/alert Behavior During Therapy: WFL for tasks assessed/performed Overall Cognitive Status: Within Functional Limits for tasks assessed                                        General Comments      Exercises Total Joint Exercises Ankle Circles/Pumps: AROM;Strengthening;Both;5 reps;10 reps Quad Sets: Strengthening;Both;10 reps Gluteal Sets: Strengthening;Both;10 reps Heel Slides: Strengthening;Right;10 reps Hip ABduction/ADduction: AROM;AAROM;Strengthening;Both;10 reps (AAROM on the LLE) Straight Leg Raises: AROM;AAROM;Strengthening;Both;10 reps (AAROM on the LLE) Long [employer]: AROM;Strengthening;Both;10 reps Knee Flexion: AROM;Strengthening;Both;10 reps Other Exercises Other Exercises: HEP education per handout Other Exercises: Anterior hip precaution education per handout   Assessment/Plan    PT Assessment Patient needs continued PT services  PT Problem List Decreased strength;Decreased activity tolerance;Decreased balance;Decreased mobility;Decreased knowledge of use of DME;Pain       PT Treatment Interventions DME instruction;Gait training;Stair training;Functional mobility training;Therapeutic activities;Therapeutic exercise;Balance training;Patient/family education    PT Goals (Current goals can be found in the Care Plan section)  Acute Rehab PT Goals Patient Stated Goal: To be able to walk around PT Goal Formulation: With patient Time For Goal Achievement: 10/10/20 Potential to Achieve Goals: Good    Frequency BID   Barriers to discharge        Co-evaluation               AM-PAC PT "6 Clicks" Mobility  Outcome Measure Help needed turning from your back to your side while in a flat bed without  using bedrails?: A Little Help needed moving from lying on your back to sitting on the side of a flat bed without using bedrails?: A Little Help needed moving to and from a bed to a chair (including a wheelchair)?: A Little Help needed standing up from a chair using your arms (e.g., wheelchair or bedside chair)?: A Little Help needed to walk in hospital room?: A Little Help needed climbing 3-5 steps with a railing? : A Lot 6 Click Score: 17    End of Session Equipment Utilized During Treatment: Gait belt Activity Tolerance: Other (comment) (ambulation limited by nausea and dizziness) Patient left: in bed;with call bell/phone within reach;with bed alarm set Nurse Communication: Mobility status;Other (comment) (Check on SCDs, notified of BP in sitting per above) PT Visit Diagnosis: Other abnormalities of gait and mobility (R26.89);Muscle weakness (generalized) (M62.81);Pain Pain - Right/Left: Left Pain - part of body: Hip    Time: 2542-7062 PT Time Calculation (min) (ACUTE ONLY): 43 min   Charges:   PT Evaluation $PT Eval Moderate Complexity: 1 Mod PT Treatments $Therapeutic Exercise: 8-22 mins        D. Royetta Asal PT, DPT 09/27/20, 5:15 PM

## 2020-09-27 NOTE — H&P (Signed)
The patient has been re-examined, and the chart reviewed, and there have been no interval changes to the documented history and physical.  Plan a left total hip today.  Anesthesia is not consulted regarding a peripheral nerve block for post-operative pain.  The risks, benefits, and alternatives have been discussed at length, and the patient is willing to proceed.    

## 2020-09-27 NOTE — Transfer of Care (Signed)
Immediate Anesthesia Transfer of Care Note  Patient: Amber Farrell  Procedure(s) Performed: TOTAL HIP ARTHROPLASTY ANTERIOR APPROACH (Left Hip)  Patient Location: PACU  Anesthesia Type:Spinal  Level of Consciousness: awake, alert  and oriented  Airway & Oxygen Therapy: Patient Spontanous Breathing and Patient connected to nasal cannula oxygen  Post-op Assessment: Report given to RN and Post -op Vital signs reviewed and stable  Post vital signs: Reviewed and stable  Last Vitals:  Vitals Value Taken Time  BP 119/76 09/27/20 1313  Temp    Pulse 68 09/27/20 1313  Resp 19 09/27/20 1313  SpO2 100 % 09/27/20 1313  Vitals shown include unvalidated device data.  Last Pain:  Vitals:   09/27/20 0934  TempSrc: Tympanic  PainSc: 0-No pain         Complications: No complications documented.

## 2020-09-27 NOTE — Anesthesia Procedure Notes (Signed)
Spinal  Patient location during procedure: OR Start time: 09/27/2020 11:21 AM End time: 09/27/2020 11:27 AM Staffing Performed: resident/CRNA  Anesthesiologist: Martha Clan, MD Resident/CRNA: Jonna Clark, CRNA Preanesthetic Checklist Completed: patient identified, IV checked, site marked, risks and benefits discussed, surgical consent, monitors and equipment checked, pre-op evaluation and timeout performed Spinal Block Patient position: sitting Prep: Betadine Patient monitoring: heart rate, continuous pulse ox, blood pressure and cardiac monitor Approach: midline Location: L4-5 Injection technique: single-shot Needle Needle type: Whitacre and Introducer  Needle gauge: 24 G Needle length: 9 cm Additional Notes Negative paresthesia. Negative blood return. Positive free-flowing CSF. Expiration date of kit checked and confirmed. Patient tolerated procedure well, without complications.

## 2020-09-28 DIAGNOSIS — M1612 Unilateral primary osteoarthritis, left hip: Secondary | ICD-10-CM | POA: Diagnosis not present

## 2020-09-28 LAB — CBC
HCT: 33.6 % — ABNORMAL LOW (ref 36.0–46.0)
Hemoglobin: 11.2 g/dL — ABNORMAL LOW (ref 12.0–15.0)
MCH: 30.5 pg (ref 26.0–34.0)
MCHC: 33.3 g/dL (ref 30.0–36.0)
MCV: 91.6 fL (ref 80.0–100.0)
Platelets: 274 10*3/uL (ref 150–400)
RBC: 3.67 MIL/uL — ABNORMAL LOW (ref 3.87–5.11)
RDW: 13.4 % (ref 11.5–15.5)
WBC: 5.7 10*3/uL (ref 4.0–10.5)
nRBC: 0 % (ref 0.0–0.2)

## 2020-09-28 LAB — BASIC METABOLIC PANEL
Anion gap: 6 (ref 5–15)
BUN: 17 mg/dL (ref 8–23)
CO2: 27 mmol/L (ref 22–32)
Calcium: 8.5 mg/dL — ABNORMAL LOW (ref 8.9–10.3)
Chloride: 97 mmol/L — ABNORMAL LOW (ref 98–111)
Creatinine, Ser: 0.69 mg/dL (ref 0.44–1.00)
GFR, Estimated: >60 mL/min (ref >60)
Glucose, Bld: 124 mg/dL — ABNORMAL HIGH (ref 70–99)
Potassium: 3.5 mmol/L (ref 3.5–5.1)
Sodium: 130 mmol/L — ABNORMAL LOW (ref 135–145)

## 2020-09-28 MED ORDER — HYDROCODONE-ACETAMINOPHEN 7.5-325 MG PO TABS: 1.0000 | ORAL_TABLET | ORAL | 0 refills | Status: AC | PRN

## 2020-09-28 MED ORDER — METHOCARBAMOL 500 MG PO TABS: 500.0000 mg | ORAL_TABLET | Freq: Four times a day (QID) | ORAL | 0 refills | Status: AC | PRN

## 2020-09-28 MED ORDER — ASPIRIN 81 MG PO CHEW: 81.0000 mg | CHEWABLE_TABLET | Freq: Two times a day (BID) | ORAL | 0 refills | Status: AC

## 2020-09-28 MED ORDER — DOCUSATE SODIUM 100 MG PO CAPS: 100.0000 mg | ORAL_CAPSULE | Freq: Two times a day (BID) | ORAL | 0 refills | Status: AC

## 2020-09-28 MED ORDER — ONDANSETRON HCL 4 MG PO TABS: 4.0000 mg | ORAL_TABLET | Freq: Four times a day (QID) | ORAL | 0 refills | Status: AC | PRN

## 2020-09-28 NOTE — Progress Notes (Signed)
Physical Therapy Treatment Patient Details Name: Amber Farrell MRN: [id number] DOB: [birth year] Today's Date: 09/28/2020    History of Present Illness Pt is a 74 yo female diagnosed with L hip OA and is s/p elective L THR.  PMH includes: HTN, hernia, GERD, and uterine CA.    PT Comments    Pt was pleasant and motivated to participate during the session.  Pt continued to perform very well requiring no physical assistance with any functional task. Pt also demonstrated good carryover regarding proper sequencing with stairs and was able to ascend/descend 4 steps with one rail with good control and stability.  Pt's spouse present for stair training with all questions/concerns addressed.  Pt reported no adverse symptoms during the session with SpO2 and HR WNL.  Pt will benefit from HHPT services upon discharge to safely address deficits listed in patient problem list for decreased caregiver assistance and eventual return to PLOF.     Follow Up Recommendations  Home health PT;Supervision for mobility/OOB     Equipment Recommendations  Rolling walker with 5" wheels    Recommendations for Other Services       Precautions / Restrictions Precautions Precautions: Fall;Anterior Hip Precaution Booklet Issued: Yes (comment) Restrictions Weight Bearing Restrictions: Yes LLE Weight Bearing: Weight bearing as tolerated    Mobility  Bed Mobility Overal bed mobility: Needs Assistance Bed Mobility: Supine to Sit     Supine to sit: Supervision     General bed mobility comments: NT, pt in recliner  Transfers Overall transfer level: Needs assistance Equipment used: Rolling walker (2 wheeled) Transfers: Sit to/from Stand Sit to Stand: Supervision         General transfer comment: Min verbal cues for sequencing for hand placement with fair eccentric and goood concentric control; eccentric control imroved with cuing for sequencing  Ambulation/Gait Ambulation/Gait assistance: Min  guard;Supervision Gait Distance (Feet): 200 Feet Assistive device: Rolling walker (2 wheeled) Gait Pattern/deviations: Step-to pattern;Decreased step length - right;Decreased stance time - left;Step-through pattern Gait velocity: decreased   General Gait Details: Pt steady with amb with gait pattern slowly progressing from step-to pattern to step-through pattern with good cadence   Stairs Stairs: Yes Stairs assistance: Min guard Stair Management: Two rails;One rail Right;Forwards;Step to pattern Number of Stairs: 4 General stair comments: Good stability and carryover of proper sequencing with pt's spouse present for education and training on sequencing and proper guarding technique   Wheelchair Mobility    Modified Rankin (Stroke Patients Only)       Balance Overall balance assessment: Needs assistance   Sitting balance-Leahy Scale: Normal     Standing balance support: Bilateral upper extremity supported;During functional activity;No upper extremity supported Standing balance-Leahy Scale: Good                              Cognition Arousal/Alertness: Awake/alert Behavior During Therapy: WFL for tasks assessed/performed Overall Cognitive Status: Within Functional Limits for tasks assessed                                        Exercises Total Joint Exercises Ankle Circles/Pumps: AROM;Strengthening;Both;10 reps Quad Sets: Strengthening;Both;10 reps Gluteal Sets: Strengthening;Both;10 reps Long Arc Quad: AROM;Strengthening;Both;10 reps Knee Flexion: AROM;Strengthening;Both;10 reps Marching in Standing: AROM;Strengthening;Both;10 reps Other Exercises Other Exercises: Dynamic and static standing balance training without UE support with various foot positions and reaching outside  BOS    General Comments        Pertinent Vitals/Pain Pain Assessment: 0-10 Pain Score: 3  Pain Location: L thigh Pain Descriptors / Indicators:  Sore;Aching Pain Intervention(s): Premedicated before session;Monitored during session    Home Living                      Prior Function            PT Goals (current goals can now be found in the care plan section) Progress towards PT goals: Progressing toward goals    Frequency    BID      PT Plan Current plan remains appropriate    Co-evaluation              AM-PAC PT "6 Clicks" Mobility   Outcome Measure  Help needed turning from your back to your side while in a flat bed without using bedrails?: A Little Help needed moving from lying on your back to sitting on the side of a flat bed without using bedrails?: A Little Help needed moving to and from a bed to a chair (including a wheelchair)?: A Little Help needed standing up from a chair using your arms (e.g., wheelchair or bedside chair)?: A Little Help needed to walk in hospital room?: A Little Help needed climbing 3-5 steps with a railing? : A Little 6 Click Score: 18    End of Session Equipment Utilized During Treatment: Gait belt Activity Tolerance: Patient tolerated treatment well Patient left: in chair;with call bell/phone within reach;with chair alarm set;with SCD's reapplied;with family/visitor present Nurse Communication: Mobility status PT Visit Diagnosis: Other abnormalities of gait and mobility (R26.89);Muscle weakness (generalized) (M62.81);Pain Pain - Right/Left: Left Pain - part of body: Hip     Time: 5697-9480 PT Time Calculation (min) (ACUTE ONLY): 25 min  Charges:  $Gait Training: 8-22 mins $Therapeutic Exercise: 8-22 mins                     D. Scott Willys Salvino PT, DPT 09/28/20, 2:41 PM

## 2020-09-28 NOTE — TOC Initial Note (Signed)
Transition of Care [facility]) - Initial/Assessment Note    Patient Details  Name: Amber Farrell MRN: [id number] Date of Birth: 10/17/1946  [facility] CM/SW Contact:    Magnus Ivan, LCSW Phone Number: 09/28/2020, 1:56 PM  Clinical Narrative:             CSW spoke to patient. Patient reported she lives with her husband. PCP is [facility]. Pharmacy is [facility] in [place]. Patient drives herself to appointments. No SNF or HH history. Patient has a BSC, cane, and raised toilet seat at home. Explained recommendations for RW and HHPT. Patient was agreeable to RW being ordered. She reported she does not want HHPT, plans to use [facility] for Outpatient PT. No additional needs identified prior to discharge.   Expected Discharge Plan: OP Rehab Barriers to Discharge: Barriers Resolved   Patient Goals and CMS Choice Patient states their goals for this hospitalization and ongoing recovery are:: outpatient rehab CMS Medicare.gov Compare Post Acute Care list provided to:: Patient Choice offered to / list presented to : Patient  Expected Discharge Plan and Services Expected Discharge Plan: OP Rehab       Living arrangements for the past 2 months: Single Family Home Expected Discharge Date: 09/28/20                                    Prior Living Arrangements/Services Living arrangements for the past 2 months: Single Family Home Lives with:: Spouse Patient language and need for interpreter reviewed:: Yes Do you feel safe going back to the place where you live?: Yes      Need for Family Participation in Patient Care: Yes (Comment) Care giver support system in place?: Yes (comment) Current home services: DME Criminal Activity/Legal Involvement Pertinent to Current Situation/Hospitalization: No - Comment as needed  Activities of Daily Living Home Assistive Devices/Equipment: Cane (specify quad or straight), Dentures (specify type), Eyeglasses ADL Screening  (condition at time of admission) Patient's cognitive ability adequate to safely complete daily activities?: Yes Is the patient deaf or have difficulty hearing?: No Does the patient have difficulty seeing, even when wearing glasses/contacts?: No Does the patient have difficulty concentrating, remembering, or making decisions?: No Patient able to express need for assistance with ADLs?: Yes Does the patient have difficulty dressing or bathing?: No Independently performs ADLs?: Yes (appropriate for developmental age) Does the patient have difficulty walking or climbing stairs?: No Weakness of Legs: None Weakness of Arms/Hands: None  Permission Sought/Granted Permission sought to share information with : Chartered certified accountant granted to share information with : Yes, Verbal Permission Granted              Emotional Assessment       Orientation: : Oriented to Situation, Oriented to  Time, Oriented to Place, Oriented to Self Alcohol / Substance Use: Not Applicable Psych Involvement: No (comment)  Admission diagnosis:  History of total hip replacement, left [Q59.563] Patient Active Problem List   Diagnosis Date Noted  . History of total hip replacement, left 09/27/2020   PCP:  [facility] [place] Pharmacy:   [facility] [place], [address] [place] [address] Phone: [phone] Fax: [phone]     Social Determinants of Health (SDOH) Interventions    Readmission Risk Interventions No flowsheet data found.

## 2020-09-28 NOTE — Progress Notes (Signed)
Physical Therapy Treatment Patient Details Name: Amber Farrell MRN: [id number] DOB: [birth year] Today's Date: 09/28/2020    History of Present Illness Pt is a 74 yo female diagnosed with L hip OA and is s/p elective L THR.  PMH includes: HTN, hernia, GERD, and uterine CA.    PT Comments    Pt was pleasant and motivated to participate during the session and made good progress towards goals.  Pt did not require physical assist with any functional task.  Pt was steady with transfers with good eccentric/concentric control and was able to amb 200' this session with improved gait pattern and cadence.  Pt was steady ascending and descending steps both with two rails as well as with one rail with good carryover regarding proper sequencing.  Pt spouse training to be provided in PM for proper guarding technique with pt ascending/descending stairs.  Pt will benefit from HHPT services upon discharge to safely address deficits listed in patient problem list for decreased caregiver assistance and eventual return to PLOF.      Follow Up Recommendations  Home health PT;Supervision for mobility/OOB     Equipment Recommendations  Rolling walker with 5" wheels    Recommendations for Other Services       Precautions / Restrictions Precautions Precautions: Fall;Anterior Hip Precaution Booklet Issued: Yes (comment) Restrictions Weight Bearing Restrictions: Yes LLE Weight Bearing: Weight bearing as tolerated    Mobility  Bed Mobility Overal bed mobility: Needs Assistance Bed Mobility: Supine to Sit     Supine to sit: Supervision     General bed mobility comments: Extra time and effort but no physical assist needed with sup to sit this session  Transfers Overall transfer level: Needs assistance Equipment used: Rolling walker (2 wheeled) Transfers: Sit to/from Stand Sit to Stand: Supervision         General transfer comment: Min verbal cues for sequencing with good eccentric and concentric  control  Ambulation/Gait Ambulation/Gait assistance: Min guard Gait Distance (Feet): 200 Feet Assistive device: Rolling walker (2 wheeled) Gait Pattern/deviations: Step-to pattern;Decreased step length - right;Decreased stance time - left;Step-through pattern Gait velocity: decreased   General Gait Details: Pt steady with amb with gait pattern slowly progressing from step-to pattern to step-through pattern with good cadence   Stairs Stairs: Yes Stairs assistance: Min guard Stair Management: Two rails;One rail Right Number of Stairs: 4 General stair comments: Ascend/descend 2 steps x 2 with B rails and then 4 steps with R rail with min to mod verbal and visual cues for sequencing with good control and stability   Wheelchair Mobility    Modified Rankin (Stroke Patients Only)       Balance Overall balance assessment: Needs assistance   Sitting balance-Leahy Scale: Normal     Standing balance support: Bilateral upper extremity supported;During functional activity Standing balance-Leahy Scale: Good                              Cognition Arousal/Alertness: Awake/alert Behavior During Therapy: WFL for tasks assessed/performed Overall Cognitive Status: Within Functional Limits for tasks assessed                                        Exercises Total Joint Exercises Ankle Circles/Pumps: AROM;Strengthening;Both;10 reps Quad Sets: Strengthening;Both;10 reps Gluteal Sets: Strengthening;Both;10 reps Long Arc Quad: AROM;Strengthening;Both;10 reps Knee Flexion: AROM;Strengthening;Both;10 reps Marching in Standing:  AROM;Strengthening;Both;10 reps Other Exercises Other Exercises: Car transfer sequencing verbal/visual education with chair simulation    General Comments        Pertinent Vitals/Pain Pain Assessment: 0-10 Pain Score: 3  Pain Location: L thigh Pain Descriptors / Indicators: Sore;Aching Pain Intervention(s): Premedicated before  session;Monitored during session    Home Living                      Prior Function            PT Goals (current goals can now be found in the care plan section) Progress towards PT goals: Progressing toward goals    Frequency    BID      PT Plan Current plan remains appropriate    Co-evaluation              AM-PAC PT "6 Clicks" Mobility   Outcome Measure  Help needed turning from your back to your side while in a flat bed without using bedrails?: A Little Help needed moving from lying on your back to sitting on the side of a flat bed without using bedrails?: A Little Help needed moving to and from a bed to a chair (including a wheelchair)?: A Little Help needed standing up from a chair using your arms (e.g., wheelchair or bedside chair)?: A Little Help needed to walk in hospital room?: A Little Help needed climbing 3-5 steps with a railing? : A Little 6 Click Score: 18    End of Session Equipment Utilized During Treatment: Gait belt Activity Tolerance: Patient tolerated treatment well Patient left: in chair;with call bell/phone within reach;with chair alarm set;with SCD's reapplied Nurse Communication: Mobility status PT Visit Diagnosis: Other abnormalities of gait and mobility (R26.89);Muscle weakness (generalized) (M62.81);Pain Pain - Right/Left: Left Pain - part of body: Hip     Time: 0370-4888 PT Time Calculation (min) (ACUTE ONLY): 39 min  Charges:  $Gait Training: 23-37 mins $Therapeutic Exercise: 8-22 mins                     D. Scott Amelia Burgard PT, DPT 09/28/20, 12:18 PM

## 2020-09-28 NOTE — Care Management Obs Status (Signed)
[facility] NOTIFICATION   Patient Details  Name: Amber Farrell MRN: [id number] Date of Birth: 1946/09/26  Medicare Observation Status Notification Given:  Yes  Reviewed with patient via phone with her permission due to patient discharging soon.   [place], LCSW 09/28/2020, 2:01 PM

## 2020-09-28 NOTE — Discharge Instructions (Signed)

## 2020-09-28 NOTE — Discharge Summary (Signed)
Physician Discharge Summary  Patient ID: Amber Farrell MRN: [id number] DOB/AGE: 12-14-1945 74 y.o.  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 Anemia   . Cancer ([place])    uterine  . GERD (gastroesophageal reflux disease)   . History of hiatal hernia   . Hypertension     Surgeries: Procedure(s): TOTAL HIP ARTHROPLASTY ANTERIOR APPROACH on 09/27/2020   Consultants (if any):   Discharged Condition: Improved  Hospital Course: Amber Farrell is an 74 y.o. 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    She was given perioperative antibiotics:  Anti-infectives (From admission, onward)   Start     Dose/Rate Route Frequency Ordered Stop   09/27/20 1800  ceFAZolin (ANCEF) IVPB 1 g/50 mL premix        1 g 100 mL/hr over 30 Minutes Intravenous Every 6 hours 09/27/20 1511 09/28/20 0127   09/27/20 0944  ceFAZolin (ANCEF) 2-4 GM/100ML-% IVPB       Note to Pharmacy: Norton Blizzard  : cabinet override      09/27/20 0944 09/27/20 1141   09/27/20 0600  ceFAZolin (ANCEF) IVPB 2g/100 mL premix        2 g 200 mL/hr over 30 Minutes Intravenous On call to O.R. 09/26/20 2250 09/27/20 1210    .  She was given sequential compression devices, early ambulation, and Aspirin for DVT prophylaxis.  She benefited maximally from the hospital stay and there were no complications.    Recent vital signs:  Vitals:   09/27/20 2359 09/28/20 0340  BP: (!) 144/79 135/86  Pulse: 65 66  Resp: 15 16  Temp: 98.1 F (36.7 C) 98.1 F (36.7 C)   SpO2: 96% 97%    Recent laboratory studies:  Lab Results  Component Value Date   HGB 11.2 (L) 09/28/2020   HGB 13.3 09/20/2020   Lab Results  Component Value Date   WBC 5.7 09/28/2020   PLT 274 09/28/2020   Lab Results  Component Value Date   INR 1.0 09/20/2020   Lab Results  Component Value Date   NA 130 (L) 09/28/2020   K 3.5 09/28/2020   CL 97 (L) 09/28/2020   CO2 27 09/28/2020   BUN 17 09/28/2020   CREATININE 0.69 09/28/2020   GLUCOSE 124 (H) 09/28/2020    Discharge Medications:   Allergies as of 09/28/2020   No Known Allergies     Medication List    STOP taking these medications   aspirin EC 81 MG tablet Replaced by: aspirin 81 MG chewable tablet     TAKE these medications   acetaminophen 650 MG CR tablet Commonly known as: TYLENOL Take 1,300 mg by mouth every 8 (eight) hours as needed for pain.   amLODipine 2.5 MG tablet Commonly known as: NORVASC Take 2.5 mg by mouth every evening.   aspirin 81 MG chewable tablet Chew 1 tablet (81 mg total) by mouth 2 (two) times daily. Replaces: aspirin EC 81 MG tablet   CAL-MAG PO Take 2 tablets by mouth daily. With potassium   cholecalciferol 25 MCG (1000 UNIT) tablet Commonly known as: VITAMIN D3  Take 1,000 Units by mouth daily.   clobetasol cream 0.05 % Commonly known as: TEMOVATE Apply 1 application topically 2 (two) times daily as needed (itching).   docusate sodium 100 MG capsule Commonly known as: COLACE Take 1 capsule (100 mg total) by mouth 2 (two) times daily.   Ferrous Gluconate 256 (28 Fe) MG Tabs Take 56 mg by mouth daily with lunch.   gabapentin 300 MG capsule Commonly known as: NEURONTIN Take 300-600 mg by mouth See admin instructions. Take 600 mg in the morning, 300 mg at noon, 300 mg in the evening   hydrochlorothiazide 25 MG tablet Commonly known as: HYDRODIURIL Take 25 mg by mouth daily.   HYDROcodone-acetaminophen 7.5-325 MG tablet Commonly known as: NORCO Take 1 tablet by  mouth every 4 (four) hours as needed for severe pain (pain score 7-10).   loratadine 10 MG tablet Commonly known as: CLARITIN Take 10 mg by mouth daily.   losartan 100 MG tablet Commonly known as: COZAAR Take 100 mg by mouth every evening.   LUBRICATING EYE DROPS OP Place 1 drop into both eyes daily.   methocarbamol 500 MG tablet Commonly known as: Robaxin Take 1 tablet (500 mg total) by mouth every 6 (six) hours as needed for muscle spasms.   omeprazole 20 MG capsule Commonly known as: PRILOSEC Take 20 mg by mouth every evening.   ondansetron 4 MG tablet Commonly known as: ZOFRAN Take 1 tablet (4 mg total) by mouth every 6 (six) hours as needed for nausea.   OVER THE COUNTER MEDICATION Take 4 tablets by mouth 3 (three) times daily. Relief factor otc supplement            Durable Medical Equipment  (From admission, onward)         Start     Ordered   09/28/20 0652  For home use only DME 3 n 1  Once        09/28/20 0651   09/28/20 0652  For home use only DME Walker rolling  Once       Question Answer Comment  Walker: With 5 Inch Wheels   Patient needs a walker to treat with the following condition Osteoarthritis of left hip      09/28/20 0651          Diagnostic Studies: DG HIP OPERATIVE UNILAT W OR W/O PELVIS LEFT  Result Date: 09/27/2020 CLINICAL DATA:  Portable imaging for left hip arthroplasty. EXAM: OPERATIVE LEFT HIP (WITH PELVIS IF PERFORMED) 3 VIEWS TECHNIQUE: Fluoroscopic spot image(s) were submitted for interpretation post-operatively. COMPARISON:  None. FINDINGS: Three submitted images show placement of a total left hip arthroplasty. The components appear well seated and aligned. No evidence of a complication. IMPRESSION: Portable imaging provided for left hip total arthroplasty. Electronically Signed   By: Lajean Manes M.D.   On: 09/27/2020 13:10    Disposition: Discharge disposition: 01-Home or Self Care     Return in 2 weeks for staple  removal.  Call office to confirm appointment [phone]       Signed: Carlynn Spry ,PA-C  09/28/2020, 6:52 AM

## 2020-09-28 NOTE — Progress Notes (Signed)
Subjective:  Patient reports pain as mild.    Objective:   VITALS:   Vitals:   09/27/20 1833 09/27/20 1956 09/27/20 2359 09/28/20 0340  BP: (!) 144/75 (!) 156/73 (!) 144/79 135/86  Pulse: 63 63 65 66  Resp: $Remo'15 15 15 16  'IZuwB$ Temp: 97.6 F (36.4 C) 97.7 F (36.5 C) 98.1 F (36.7 C) 98.1 F (36.7 C)  TempSrc: Oral     SpO2: 99% 99% 96% 97%  Weight:      Height:        PHYSICAL EXAM:  Neurologically intact ABD soft Neurovascular intact Sensation intact distally Intact pulses distally Dorsiflexion/Plantar flexion intact Incision: dressing C/D/I No cellulitis present Compartment soft  LABS  Results for orders placed or performed during the hospital encounter of 09/27/20 (from the past 24 hour(s))  ABO/Rh     Status: None   Collection Time: 09/27/20  9:54 AM  Result Value Ref Range   ABO/RH(D)      A POS Performed at [facility], [address] [place], [place] [address]   CBC     Status: Abnormal   Collection Time: 09/28/20  4:17 AM  Result Value Ref Range   WBC 5.7 4.0 - 10.5 K/uL   RBC 3.67 (L) 3.87 - 5.11 MIL/uL   Hemoglobin 11.2 (L) 12.0 - 15.0 g/dL   HCT 33.6 (L) 36 - 46 %   MCV 91.6 80.0 - 100.0 fL   MCH 30.5 26.0 - 34.0 pg   MCHC 33.3 30.0 - 36.0 g/dL   RDW 13.4 11.5 - 15.5 %   Platelets 274 150 - 400 K/uL   nRBC 0.0 0.0 - 0.2 %  Basic metabolic panel     Status: Abnormal   Collection Time: 09/28/20  4:17 AM  Result Value Ref Range   Sodium 130 (L) 135 - 145 mmol/L   Potassium 3.5 3.5 - 5.1 mmol/L   Chloride 97 (L) 98 - 111 mmol/L   CO2 27 22 - 32 mmol/L   Glucose, Bld 124 (H) 70 - 99 mg/dL   BUN 17 8 - 23 mg/dL   Creatinine, Ser 0.69 0.44 - 1.00 mg/dL   Calcium 8.5 (L) 8.9 - 10.3 mg/dL   GFR, Estimated >60 >60 mL/min   Anion gap 6 5 - 15    DG HIP OPERATIVE UNILAT W OR W/O PELVIS LEFT  Result Date: 09/27/2020 CLINICAL DATA:  Portable imaging for left hip arthroplasty. EXAM: OPERATIVE LEFT HIP (WITH PELVIS IF PERFORMED) 3 VIEWS  TECHNIQUE: Fluoroscopic spot image(s) were submitted for interpretation post-operatively. COMPARISON:  None. FINDINGS: Three submitted images show placement of a total left hip arthroplasty. The components appear well seated and aligned. No evidence of a complication. IMPRESSION: Portable imaging provided for left hip total arthroplasty. Electronically Signed   By: Lajean Manes M.D.   On: 09/27/2020 13:10    Assessment/Plan: 1 Day Post-Op   Active Problems:   History of total hip replacement, left   Advance diet Up with therapy  May discharge home if PT goals met today   Carlynn Spry , PA-C 09/28/2020, 6:47 AM

## 2020-09-28 NOTE — Anesthesia Postprocedure Evaluation (Signed)
Anesthesia Post Note  Patient: Amber Farrell  Procedure(s) Performed: TOTAL HIP ARTHROPLASTY ANTERIOR APPROACH (Left Hip)  Patient location during evaluation: PACU Anesthesia Type: Spinal Level of consciousness: oriented and awake and alert Pain management: pain level controlled Vital Signs Assessment: post-procedure vital signs reviewed and stable Respiratory status: spontaneous breathing, respiratory function stable and patient connected to nasal cannula oxygen Cardiovascular status: blood pressure returned to baseline and stable Postop Assessment: no headache, no backache and no apparent nausea or vomiting Anesthetic complications: no   No complications documented.   Last Vitals:  Vitals:   09/27/20 2359 09/28/20 0340  BP: (!) 144/79 135/86  Pulse: 65 66  Resp: 15 16  Temp: 36.7 C 36.7 C  SpO2: 96% 97%    Last Pain:  Vitals:   09/27/20 2151  TempSrc:   PainSc: [name]

## 2020-09-29 LAB — SURGICAL PATHOLOGY

## 2020-12-01 ENCOUNTER — Other Ambulatory Visit: Payer: Self-pay | Admitting: Internal Medicine

## 2020-12-01 DIAGNOSIS — Z1231 Encounter for screening mammogram for malignant neoplasm of breast: Secondary | ICD-10-CM

## 2021-01-09 ENCOUNTER — Other Ambulatory Visit: Payer: Self-pay

## 2021-01-09 ENCOUNTER — Ambulatory Visit
Admission: RE | Admit: 2021-01-09 | Discharge: 2021-01-09 | Disposition: A | Discharge status: Home / Self Care | Payer: Medicare PPO | Source: Ambulatory Visit | Attending: Internal Medicine | Admitting: Internal Medicine

## 2021-01-09 DIAGNOSIS — Z1231 Encounter for screening mammogram for malignant neoplasm of breast: Secondary | ICD-10-CM | POA: Insufficient documentation

## 2021-01-10 ENCOUNTER — Inpatient Hospital Stay
Admission: RE | Admit: 2021-01-10 | Discharge: 2021-01-10 | Disposition: A | Discharge status: Home / Self Care | Payer: Self-pay | Source: Ambulatory Visit | Attending: *Deleted | Admitting: *Deleted

## 2021-01-10 ENCOUNTER — Other Ambulatory Visit: Payer: Self-pay | Admitting: *Deleted

## 2021-01-10 DIAGNOSIS — Z1231 Encounter for screening mammogram for malignant neoplasm of breast: Secondary | ICD-10-CM

## 2021-04-09 ENCOUNTER — Encounter: Payer: Self-pay | Admitting: *Deleted

## 2021-04-10 ENCOUNTER — Ambulatory Visit
Admission: RE | Admit: 2021-04-10 | Discharge: 2021-04-10 | Disposition: A | Discharge status: Home / Self Care | Payer: Medicare PPO | Attending: Gastroenterology | Admitting: Gastroenterology

## 2021-04-10 ENCOUNTER — Encounter
Admission: RE | Disposition: A | Discharge status: Home / Self Care | Payer: Self-pay | Source: Home / Self Care | Attending: Gastroenterology

## 2021-04-10 ENCOUNTER — Encounter: Payer: Self-pay | Admitting: *Deleted

## 2021-04-10 ENCOUNTER — Ambulatory Visit: Payer: Medicare PPO | Admitting: Certified Registered"

## 2021-04-10 DIAGNOSIS — Z809 Family history of malignant neoplasm, unspecified: Secondary | ICD-10-CM | POA: Diagnosis not present

## 2021-04-10 DIAGNOSIS — Z7982 Long term (current) use of aspirin: Secondary | ICD-10-CM | POA: Diagnosis not present

## 2021-04-10 DIAGNOSIS — Z79899 Other long term (current) drug therapy: Secondary | ICD-10-CM | POA: Insufficient documentation

## 2021-04-10 DIAGNOSIS — Z1509 Genetic susceptibility to other malignant neoplasm: Secondary | ICD-10-CM | POA: Diagnosis not present

## 2021-04-10 HISTORY — PX: ESOPHAGOGASTRODUODENOSCOPY (EGD) WITH PROPOFOL: SHX5813

## 2021-04-10 HISTORY — DX: Mixed hyperlipidemia: E78.2

## 2021-04-10 HISTORY — DX: Unspecified osteoarthritis, unspecified site: M19.90

## 2021-04-10 SURGERY — ESOPHAGOGASTRODUODENOSCOPY (EGD) WITH PROPOFOL
Anesthesia: General

## 2021-04-10 MED ORDER — PROPOFOL 500 MG/50ML IV EMUL
INTRAVENOUS | Status: DC | PRN
  Administered 2021-04-10: 145 ug/kg/min via INTRAVENOUS

## 2021-04-10 MED ORDER — PROPOFOL 10 MG/ML IV BOLUS
INTRAVENOUS | Status: DC | PRN
  Administered 2021-04-10: 30 mg via INTRAVENOUS
  Administered 2021-04-10: 10 mg via INTRAVENOUS
  Administered 2021-04-10: 30 mg via INTRAVENOUS

## 2021-04-10 MED ORDER — SODIUM CHLORIDE 0.9 % IV SOLN: INTRAVENOUS | Status: DC

## 2021-04-10 MED ORDER — GLYCOPYRROLATE 0.2 MG/ML IJ SOLN
INTRAMUSCULAR | Status: DC | PRN
  Administered 2021-04-10: .2 mg via INTRAVENOUS

## 2021-04-10 MED ORDER — LIDOCAINE HCL (CARDIAC) PF 100 MG/5ML IV SOSY
PREFILLED_SYRINGE | INTRAVENOUS | Status: DC | PRN
  Administered 2021-04-10: 20 mg via INTRAVENOUS
  Administered 2021-04-10: 80 mg via INTRAVENOUS

## 2021-04-10 NOTE — H&P (Signed)
Outpatient short stay form Pre-procedure 04/10/2021 10:14 AM Raylene Miyamoto MD, MPH  Primary Physician: Dr. 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 No blood thinners. Family history of several lynch related cancers.    Current Facility-Administered Medications:    0.9 %  sodium chloride infusion, , Intravenous, Continuous, Atthew Coutant, Hilton Cork, MD, Last Rate: 20 mL/hr at 04/10/21 1014, New Bag at 04/10/21 1014  Medications Prior to Admission  Medication Sig Dispense Refill Last Dose   acyclovir (ZOVIRAX) 400 MG tablet Take 400 mg by mouth 5 (five) times daily.      amLODipine (NORVASC) 2.5 MG tablet Take 2.5 mg by mouth every evening.   04/09/2021   aspirin 81 MG chewable tablet Chew 1 tablet (81 mg total) by mouth 2 (two) times daily. 60 tablet 0 04/09/2021   CALCIUM CARBONATE-VIT D-MIN PO Take by mouth.   04/09/2021   cholecalciferol (VITAMIN D3) 25 MCG (1000 UNIT) tablet Take 1,000 Units by mouth daily.   04/09/2021   ergocalciferol (VITAMIN D2) 1.25 MG (50000 UT) capsule Take 50,000 Units by mouth once a week.   04/09/2021   loratadine (CLARITIN) 10 MG tablet Take 10 mg by mouth daily.   04/09/2021   losartan-hydrochlorothiazide (HYZAAR) 100-25 MG tablet Take 1 tablet by mouth daily.   04/09/2021   omeprazole (PRILOSEC) 20 MG capsule Take 20 mg by mouth every evening.    04/09/2021   acetaminophen (TYLENOL) 650 MG CR tablet Take 1,300 mg by mouth every 8 (eight) hours as needed for pain.      Calcium-Magnesium (CAL-MAG PO) Take 2 tablets by mouth daily. With potassium      Carboxymethylcellul-Glycerin (LUBRICATING EYE DROPS OP) Place 1 drop into both eyes daily.      clobetasol cream (TEMOVATE) 0.98 % Apply 1 application topically 2 (two) times daily as needed (itching).      docusate sodium (COLACE) 100 MG capsule Take 1 capsule (100 mg total) by mouth 2 (two) times daily. 10 capsule 0    Ferrous  Gluconate 256 (28 Fe) MG TABS Take 56 mg by mouth daily with lunch.    04/03/2021   HYDROcodone-acetaminophen (NORCO) 7.5-325 MG tablet Take 1 tablet by mouth every 4 (four) hours as needed for severe pain (pain score 7-10). 30 tablet 0    methocarbamol (ROBAXIN) 500 MG tablet Take 1 tablet (500 mg total) by mouth every 6 (six) hours as needed for muscle spasms. 40 tablet 0    ondansetron (ZOFRAN) 4 MG tablet Take 1 tablet (4 mg total) by mouth every 6 (six) hours as needed for nausea. 20 tablet 0    OVER THE COUNTER MEDICATION Take 4 tablets by mouth 3 (three) times daily. Relief factor otc supplement        No Known Allergies   Past Medical History:  Diagnosis Date   Anemia    Arthritis    Cancer ([place])    uterine   GERD (gastroesophageal reflux disease)    History of hiatal hernia    Hyperlipidemia, mixed    Hypertension     Review of systems:  Otherwise negative.    Physical Exam  Gen: Alert, oriented. Appears stated age.  HEENT: PERRLA. Lungs: No respiratory distress CV: RRR Abd: soft, benign, no masses Ext: No edema    Planned procedures: Proceed with EGD. The patient understands the nature of the planned procedure, indications, risks, alternatives  and potential complications including but not limited to bleeding, infection, perforation, damage to internal organs and possible oversedation/side effects from anesthesia. The patient agrees and gives consent to proceed.  Please refer to procedure notes for findings, recommendations and patient disposition/instructions.     Raylene Miyamoto MD, MPH Gastroenterology 04/10/2021  10:14 AM

## 2021-04-10 NOTE — Anesthesia Preprocedure Evaluation (Signed)
Anesthesia Evaluation  Patient identified by MRN, date of birth, ID band Patient awake    Reviewed: Allergy & Precautions, H&P , NPO status , Patient's Chart, lab work & pertinent test results, reviewed documented beta blocker date and time   History of Anesthesia Complications Negative for: history of anesthetic complications  Airway Mallampati: II  TM Distance: >3 FB Neck ROM: full    Dental no notable dental hx. (+) Edentulous Upper, Edentulous Lower, Upper Dentures, Lower Dentures, Dental Advidsory Given   Pulmonary neg pulmonary ROS, neg sleep apnea, neg COPD, Patient abstained from smoking.Not current smoker, former smoker,    Pulmonary exam normal breath sounds clear to auscultation       Cardiovascular Exercise Tolerance: Good METShypertension, (-) angina(-) CAD and (-) Past MI Normal cardiovascular exam(-) dysrhythmias (-) Valvular Problems/Murmurs Rhythm:regular Rate:Normal - Systolic murmurs    Neuro/Psych negative neurological ROS  negative psych ROS   GI/Hepatic Neg liver ROS, hiatal hernia, GERD  ,  Endo/Other  negative endocrine ROSneg diabetes  Renal/GU negative Renal ROS  negative genitourinary   Musculoskeletal   Abdominal   Peds  Hematology negative hematology ROS (+) anemia ,   Anesthesia Other Findings Past Medical History: No date: Anemia No date: Cancer ([place])     Comment:  uterine No date: GERD (gastroesophageal reflux disease) No date: History of hiatal hernia No date: Hypertension   Reproductive/Obstetrics negative OB ROS                             Anesthesia Physical  Anesthesia Plan  ASA: 2  Anesthesia Plan: General   Post-op Pain Management:    Induction: Intravenous  PONV Risk Score and Plan: 3 and Propofol infusion and TIVA  Airway Management Planned: Natural Airway  Additional Equipment: None  Intra-op Plan:   Post-operative Plan:    Informed Consent: I have reviewed the patients History and Physical, chart, labs and discussed the procedure including the risks, benefits and alternatives for the proposed anesthesia with the patient or authorized representative who has indicated his/her understanding and acceptance.     Dental Advisory Given  Plan Discussed with: Anesthesiologist, CRNA and Surgeon  Anesthesia Plan Comments: (Discussed risks of anesthesia with patient, including possibility of difficulty with spontaneous ventilation under anesthesia necessitating airway intervention, PONV, and rare risks such as cardiac or respiratory or neurological events. Patient understands.)        Anesthesia Quick Evaluation

## 2021-04-10 NOTE — Anesthesia Postprocedure Evaluation (Signed)
Anesthesia Post Note  Patient: Amber Farrell  Procedure(s) Performed: ESOPHAGOGASTRODUODENOSCOPY (EGD) WITH PROPOFOL  Patient location during evaluation: Endoscopy Anesthesia Type: General Level of consciousness: awake and alert Pain management: pain level controlled Vital Signs Assessment: post-procedure vital signs reviewed and stable Respiratory status: spontaneous breathing, nonlabored ventilation, respiratory function stable and patient connected to nasal cannula oxygen Cardiovascular status: blood pressure returned to baseline and stable Postop Assessment: no apparent nausea or vomiting Anesthetic complications: no   No notable events documented.   Last Vitals:  Vitals:   04/10/21 1044 04/10/21 1054  BP: (!) 146/87 (!) 167/82  Pulse: 66 62  Resp: 17 (!) 21  Temp:    SpO2: 100% 99%    Last Pain:  Vitals:   04/10/21 1054  TempSrc:   PainSc: 0-No pain                 Arita Miss

## 2021-04-10 NOTE — Op Note (Signed)
[facility] Gastroenterology Patient Name: Amber Farrell Procedure Date: 04/10/2021 9:49 AM MRN: [id number] Account #: [id number] Date of Birth: 11-17-1945 Admit Type: Outpatient Age: 75 Room: [facility] ENDO ROOM 1 Gender: Female Note Status: Finalized Procedure:             Upper GI endoscopy Indications:           Hereditary nonpolyposis colorectal cancer (Lynch                         Syndrome) Providers:             Andrey Farmer MD, MD Referring MD:          Leonie Douglas. Doy Hutching, MD (Referring MD) Medicines:             Monitored Anesthesia Care Complications:         No immediate complications. Estimated blood loss:                         Minimal. Procedure:             Pre-Anesthesia Assessment:                        - Prior to the procedure, a History and Physical was                         performed, and patient medications and allergies were                         reviewed. The patient is competent. The risks and                         benefits of the procedure and the sedation options and                         risks were discussed with the patient. All questions                         were answered and informed consent was obtained.                         Patient identification and proposed procedure were                         verified by the physician, the nurse, the anesthetist                         and the technician in the endoscopy suite. Mental                         Status Examination: alert and oriented. Airway                         Examination: normal oropharyngeal airway and neck                         mobility. Respiratory Examination: clear to                         auscultation.  CV Examination: normal. Prophylactic                         Antibiotics: The patient does not require prophylactic                         antibiotics. Prior Anticoagulants: The patient has                         taken no previous anticoagulant or  antiplatelet                         agents. ASA Grade Assessment: II - A patient with mild                         systemic disease. After reviewing the risks and                         benefits, the patient was deemed in satisfactory                         condition to undergo the procedure. The anesthesia                         plan was to use monitored anesthesia care (MAC).                         Immediately prior to administration of medications,                         the patient was re-assessed for adequacy to receive                         sedatives. The heart rate, respiratory rate, oxygen                         saturations, blood pressure, adequacy of pulmonary                         ventilation, and response to care were monitored                         throughout the procedure. The physical status of the                         patient was re-assessed after the procedure.                        After obtaining informed consent, the endoscope was                         passed under direct vision. Throughout the procedure,                         the patient's blood pressure, pulse, and oxygen                         saturations were monitored continuously. The Endoscope  was introduced through the mouth, and advanced to the                         second part of duodenum. The upper GI endoscopy was                         accomplished without difficulty. The patient tolerated                         the procedure well. Findings:      The examined esophagus was normal.      The entire examined stomach was normal. Biopsies were taken with a cold       forceps for histology. Estimated blood loss was minimal.      The examined duodenum was normal. Impression:            - Normal esophagus.                        - Normal stomach. Biopsied.                        - Normal examined duodenum. Recommendation:        - Discharge patient to home.                         - Resume previous diet.                        - Continue present medications.                        - Await pathology results.                        - Repeat upper endoscopy in 3 years for surveillance.                        - Return to referring physician as previously                         scheduled. Procedure Code(s):     --- Professional ---                        [id number], Esophagogastroduodenoscopy, flexible,                         transoral; with biopsy, single or multiple Diagnosis Code(s):     --- Professional ---                        C18.9, Malignant neoplasm of colon, unspecified                        Z15.09, Genetic susceptibility to other malignant                         neoplasm CPT copyright 2019 American Medical Association. All rights reserved. The codes documented in this report are preliminary and upon coder review may  be revised to meet current compliance requirements. Andrey Farmer MD, MD 04/10/2021 10:34:58 AM Number of Addenda: 0  Note Initiated On: 04/10/2021 9:49 AM Estimated Blood Loss:  Estimated blood loss was minimal.      [facility]

## 2021-04-10 NOTE — Transfer of Care (Signed)
Immediate Anesthesia Transfer of Care Note  Patient: Amber Farrell  Procedure(s) Performed: ESOPHAGOGASTRODUODENOSCOPY (EGD) WITH PROPOFOL  Patient Location: Endoscopy Unit  Anesthesia Type:General  Level of Consciousness: awake, drowsy and patient cooperative  Airway & Oxygen Therapy: Patient Spontanous Breathing and Patient connected to face mask oxygen  Post-op Assessment: Report given to RN and Post -op Vital signs reviewed and stable  Post vital signs: Reviewed and stable  Last Vitals:  Vitals Value Taken Time  BP 146/87 04/10/21 1044  Temp 36.2 C 04/10/21 1034  Pulse 65 04/10/21 1046  Resp 20 04/10/21 1046  SpO2 100 % 04/10/21 1046  Vitals shown include unvalidated device data.  Last Pain:  Vitals:   04/10/21 1034  TempSrc: Temporal  PainSc: Asleep         Complications: No notable events documented.

## 2021-04-10 NOTE — Interval H&P Note (Signed)
History and Physical Interval Note:  04/10/2021 10:17 AM  Amber Farrell  has presented today for surgery, with the diagnosis of lynch syndrome.  The various methods of treatment have been discussed with the patient and family. After consideration of risks, benefits and other options for treatment, the patient has consented to  Procedure(s) with comments: ESOPHAGOGASTRODUODENOSCOPY (EGD) WITH PROPOFOL (N/A) - OFFICE HAS NOTE: "NEEDS ANTIBIOTICS PRIOR TO PROCEDURE" as a surgical intervention.  The patient's history has been reviewed, patient examined, no change in status, stable for surgery.  I have reviewed the patient's chart and labs.  Questions were answered to the patient's satisfaction.     [name]  Ok to proceed with EGD

## 2021-04-10 NOTE — Anesthesia Procedure Notes (Signed)
Procedure Name: General with mask airway Date/Time: 04/10/2021 10:25 AM Performed by: Kelton Pillar, CRNA Pre-anesthesia Checklist: Patient identified, Emergency Drugs available, Suction available and Patient being monitored Patient Re-evaluated:Patient Re-evaluated prior to induction Oxygen Delivery Method: Simple face mask Induction Type: IV induction Placement Confirmation: positive ETCO2 and CO2 detector Dental Injury: Teeth and Oropharynx as per pre-operative assessment

## 2021-04-11 ENCOUNTER — Encounter: Payer: Self-pay | Admitting: Gastroenterology

## 2021-04-11 LAB — SURGICAL PATHOLOGY

## 2021-08-09 ENCOUNTER — Encounter: Payer: Self-pay | Admitting: Ophthalmology

## 2021-08-28 NOTE — Discharge Instructions (Signed)

## 2021-08-29 ENCOUNTER — Encounter
Admission: RE | Disposition: A | Discharge status: Home / Self Care | Payer: Self-pay | Source: Home / Self Care | Attending: Ophthalmology

## 2021-08-29 ENCOUNTER — Ambulatory Visit
Admission: RE | Admit: 2021-08-29 | Discharge: 2021-08-29 | Disposition: A | Discharge status: Home / Self Care | Payer: Medicare PPO | Attending: Ophthalmology | Admitting: Ophthalmology

## 2021-08-29 ENCOUNTER — Ambulatory Visit: Payer: Medicare PPO | Admitting: Anesthesiology

## 2021-08-29 ENCOUNTER — Other Ambulatory Visit: Payer: Self-pay

## 2021-08-29 ENCOUNTER — Encounter: Payer: Self-pay | Admitting: Ophthalmology

## 2021-08-29 DIAGNOSIS — H2511 Age-related nuclear cataract, right eye: Secondary | ICD-10-CM | POA: Insufficient documentation

## 2021-08-29 DIAGNOSIS — Z79899 Other long term (current) drug therapy: Secondary | ICD-10-CM | POA: Diagnosis not present

## 2021-08-29 DIAGNOSIS — K219 Gastro-esophageal reflux disease without esophagitis: Secondary | ICD-10-CM | POA: Insufficient documentation

## 2021-08-29 DIAGNOSIS — E782 Mixed hyperlipidemia: Secondary | ICD-10-CM | POA: Diagnosis not present

## 2021-08-29 DIAGNOSIS — Z7982 Long term (current) use of aspirin: Secondary | ICD-10-CM | POA: Diagnosis not present

## 2021-08-29 DIAGNOSIS — Z87891 Personal history of nicotine dependence: Secondary | ICD-10-CM | POA: Diagnosis not present

## 2021-08-29 DIAGNOSIS — I10 Essential (primary) hypertension: Secondary | ICD-10-CM | POA: Insufficient documentation

## 2021-08-29 HISTORY — PX: CATARACT EXTRACTION W/PHACO: SHX586

## 2021-08-29 SURGERY — PHACOEMULSIFICATION, CATARACT, WITH IOL INSERTION
Anesthesia: Monitor Anesthesia Care | Site: Eye | Laterality: Right

## 2021-08-29 MED ORDER — MIDAZOLAM HCL 2 MG/2ML IJ SOLN
INTRAMUSCULAR | Status: DC | PRN
  Administered 2021-08-29: 2 mg via INTRAVENOUS

## 2021-08-29 MED ORDER — SIGHTPATH DOSE#1 BSS IO SOLN
INTRAOCULAR | Status: DC | PRN
  Administered 2021-08-29: 1 mL via INTRAMUSCULAR

## 2021-08-29 MED ORDER — CEFUROXIME OPHTHALMIC INJECTION 1 MG/0.1 ML
INJECTION | OPHTHALMIC | Status: DC | PRN
  Administered 2021-08-29: 0.1 mL via INTRACAMERAL

## 2021-08-29 MED ORDER — SIGHTPATH DOSE#1 BSS IO SOLN
INTRAOCULAR | Status: DC | PRN
  Administered 2021-08-29: 15 mL

## 2021-08-29 MED ORDER — ARMC OPHTHALMIC DILATING DROPS
1.0000 "application " | OPHTHALMIC | Status: DC | PRN
  Administered 2021-08-29 (×3): 1 via OPHTHALMIC

## 2021-08-29 MED ORDER — SIGHTPATH DOSE#1 BSS IO SOLN
INTRAOCULAR | Status: DC | PRN
  Administered 2021-08-29: 53 mL via OPHTHALMIC

## 2021-08-29 MED ORDER — FENTANYL CITRATE (PF) 100 MCG/2ML IJ SOLN
INTRAMUSCULAR | Status: DC | PRN
  Administered 2021-08-29: 50 ug via INTRAVENOUS

## 2021-08-29 MED ORDER — TETRACAINE HCL 0.5 % OP SOLN
1.0000 [drp] | OPHTHALMIC | Status: DC | PRN
  Administered 2021-08-29 (×3): 1 [drp] via OPHTHALMIC

## 2021-08-29 MED ORDER — SIGHTPATH DOSE#1 NA HYALUR & NA CHOND-NA HYALUR IO KIT
PACK | INTRAOCULAR | Status: DC | PRN
  Administered 2021-08-29: 1 via OPHTHALMIC

## 2021-08-29 MED ORDER — BRIMONIDINE TARTRATE-TIMOLOL 0.2-0.5 % OP SOLN
OPHTHALMIC | Status: DC | PRN
  Administered 2021-08-29: 1 [drp] via OPHTHALMIC

## 2021-08-29 SURGICAL SUPPLY — 16 items
CANNULA ANT/CHMB 27GA (MISCELLANEOUS) IMPLANT
GLOVE SRG 8 PF TXTR STRL LF DI (GLOVE) ×1 IMPLANT
GLOVE SURG ENC TEXT LTX SZ7.5 (GLOVE) ×2 IMPLANT
GLOVE SURG UNDER POLY LF SZ8 (GLOVE) ×2
GOWN STRL REUS W/ TWL LRG LVL3 (GOWN DISPOSABLE) ×2 IMPLANT
GOWN STRL REUS W/TWL LRG LVL3 (GOWN DISPOSABLE) ×4
LENS IOL TECNIS EYHANCE 22.5 (Intraocular Lens) ×2 IMPLANT
MARKER SKIN DUAL TIP RULER LAB (MISCELLANEOUS) ×2 IMPLANT
NEEDLE CAPSULORHEX 25GA (NEEDLE) IMPLANT
NEEDLE FILTER BLUNT 18X 1/2SAF (NEEDLE) ×2
NEEDLE FILTER BLUNT 18X1 1/2 (NEEDLE) ×2 IMPLANT
PACK EYE AFTER SURG (MISCELLANEOUS) IMPLANT
SYR 3ML LL SCALE MARK (SYRINGE) ×4 IMPLANT
SYR TB 1ML LUER SLIP (SYRINGE) ×2 IMPLANT
WATER STERILE IRR 250ML POUR (IV SOLUTION) ×2 IMPLANT
WIPE NON LINTING 3.25X3.25 (MISCELLANEOUS) ×2 IMPLANT

## 2021-08-29 NOTE — H&P (Signed)
[facility]   Primary Care Physician:  Idelle Crouch, MD Ophthalmologist: Dr. Leandrew Koyanagi  Pre-Procedure History & Physical: HPI:  Amber Farrell is a 75 y.o. female here for ophthalmic surgery.   Past Medical History:  Diagnosis Date   Anemia    Arthritis    Cancer ([place])    uterine   GERD (gastroesophageal reflux disease)    History of hiatal hernia    Hyperlipidemia, mixed    Hypertension     Past Surgical History:  Procedure Laterality Date   ABDOMINAL HYSTERECTOMY     APPENDECTOMY     at time of tubal   CATARACT EXTRACTION Left    COLONOSCOPY     ESOPHAGOGASTRODUODENOSCOPY     ESOPHAGOGASTRODUODENOSCOPY (EGD) WITH PROPOFOL N/A 04/10/2021   Procedure: ESOPHAGOGASTRODUODENOSCOPY (EGD) WITH PROPOFOL;  Surgeon: [name], MD;  Location: [facility] ENDOSCOPY;  Service: Endoscopy;  Laterality: N/A;  OFFICE HAS NOTE: "NEEDS ANTIBIOTICS PRIOR TO PROCEDURE"   OOPHORECTOMY     TOTAL HIP ARTHROPLASTY Left 09/27/2020   Procedure: TOTAL HIP ARTHROPLASTY ANTERIOR APPROACH;  Surgeon: Lovell Sheehan, MD;  Location: [facility] ORS;  Service: Orthopedics;  Laterality: Left;   TUBAL LIGATION      Prior to Admission medications   Medication Sig Start Date End Date Taking? Authorizing Provider  amLODipine (NORVASC) 2.5 MG tablet Take 2.5 mg by mouth every evening.   Yes [provider]  aspirin 81 MG chewable tablet Chew 1 tablet (81 mg total) by mouth 2 (two) times daily. 09/28/20  Yes Carlynn Spry, PA-C  Calcium-Magnesium (CAL-MAG PO) Take 2 tablets by mouth daily. With potassium   Yes [provider]  Carboxymethylcellul-Glycerin (LUBRICATING EYE DROPS OP) Place 1 drop into both eyes daily.   Yes [provider]  cholecalciferol (VITAMIN D3) 25 MCG (1000 UNIT) tablet Take 1,000 Units by mouth daily.   Yes [provider]  clobetasol cream (TEMOVATE) 2.02 % Apply 1 application topically 2 (two) times daily as needed (itching).   Yes  [provider]  docusate sodium (COLACE) 100 MG capsule Take 1 capsule (100 mg total) by mouth 2 (two) times daily. 09/28/20  Yes Carlynn Spry, PA-C  Ferrous Gluconate 256 (28 Fe) MG TABS Take 56 mg by mouth daily with lunch.    Yes [provider]  loratadine (CLARITIN) 10 MG tablet Take 10 mg by mouth daily.   Yes [provider]  losartan-hydrochlorothiazide (HYZAAR) 100-25 MG tablet Take 1 tablet by mouth daily.   Yes [provider]  omeprazole (PRILOSEC) 20 MG capsule Take 20 mg by mouth every evening.    Yes [provider]  OVER THE COUNTER MEDICATION Take 4 tablets by mouth daily. Relief factor otc supplement   Yes [provider]  Quercetin 250 MG TABS Take by mouth daily.   Yes [provider]  acetaminophen (TYLENOL) 650 MG CR tablet Take 1,300 mg by mouth every 8 (eight) hours as needed for pain. Patient not taking: Reported on 08/09/2021    [provider]  acyclovir (ZOVIRAX) 400 MG tablet Take 400 mg by mouth 5 (five) times daily. Patient not taking: Reported on 08/09/2021    [provider]  CALCIUM CARBONATE-VIT D-MIN PO Take by mouth. Patient not taking: Reported on 08/09/2021    [provider]  ergocalciferol (VITAMIN D2) 1.25 MG (50000 UT) capsule Take 50,000 Units by mouth once a week. Patient not taking: Reported on 08/09/2021    [provider]  HYDROcodone-acetaminophen (  NORCO) 7.5-325 MG tablet Take 1 tablet by mouth every 4 (four) hours as needed for severe pain (pain score 7-10). Patient not taking: Reported on 08/09/2021 09/28/20   Carlynn Spry, PA-C  methocarbamol (ROBAXIN) 500 MG tablet Take 1 tablet (500 mg total) by mouth every 6 (six) hours as needed for muscle spasms. Patient not taking: Reported on 08/09/2021 09/28/20   Carlynn Spry, PA-C  ondansetron (ZOFRAN) 4 MG tablet Take 1 tablet (4 mg total) by mouth every 6 (six) hours as needed for nausea. Patient  not taking: Reported on 08/09/2021 09/28/20   Carlynn Spry, PA-C    Allergies as of 07/31/2021   (No Known Allergies)    Family History  Problem Relation Age of Onset   Breast cancer Sister 88   Breast cancer Maternal Grandmother     Social History   Socioeconomic History   Marital status: Married    Spouse name: Not on file   Number of children: Not on file   Years of education: Not on file   Highest education level: Not on file  Occupational History   Not on file  Tobacco Use   Smoking status: Former    Packs/day: 1.50    Years: 20.00    Pack years: 30.00    Types: Cigarettes    Quit date: 1989    Years since quitting: 33.8   Smokeless tobacco: Never  Vaping Use   Vaping Use: Never used  Substance and Sexual Activity   Alcohol use: Yes    Alcohol/week: 12.0 standard drinks    Types: 12 Glasses of wine per week    Comment: wine   Drug use: Never   Sexual activity: Not on file  Other Topics Concern   Not on file  Social History Narrative   Not on file   Social Determinants of Health   Financial Resource Strain: Not on file  Food Insecurity: Not on file  Transportation Needs: Not on file  Physical Activity: Not on file  Stress: Not on file  Social Connections: Not on file  Intimate Partner Violence: Not on file    Review of Systems: See HPI, otherwise negative ROS  Physical Exam: BP (!) 144/90   Pulse 85   Temp 97.8 F (36.6 C) (Temporal)   Ht 5\' 1"  (1.549 m)   Wt 71.7 kg   SpO2 98%   BMI 29.85 kg/m  General:   Alert,  pleasant and cooperative in NAD Head:  Normocephalic and atraumatic. Lungs:  Clear to auscultation.    Heart:  Regular rate and rhythm.   Impression/Plan: Amber Farrell is here for ophthalmic surgery.  Risks, benefits, limitations, and alternatives regarding ophthalmic surgery have been reviewed with the patient.  Questions have been answered.  All parties agreeable.   Leandrew Koyanagi, MD  08/29/2021, 9:23 AM

## 2021-08-29 NOTE — Anesthesia Preprocedure Evaluation (Signed)
Anesthesia Evaluation  Patient identified by MRN, date of birth, ID band Patient awake    Reviewed: Allergy & Precautions, NPO status   Airway Mallampati: II  TM Distance: >3 FB     Dental   Pulmonary former smoker,    Pulmonary exam normal        Cardiovascular hypertension,  Rhythm:Regular Rate:Normal  HLD   Neuro/Psych    GI/Hepatic hiatal hernia, GERD  Controlled,  Endo/Other    Renal/GU      Musculoskeletal  (+) Arthritis ,   Abdominal   Peds  Hematology  (+) anemia ,   Anesthesia Other Findings   Reproductive/Obstetrics                             Anesthesia Physical Anesthesia Plan  ASA: 2  Anesthesia Plan: MAC   Post-op Pain Management:    Induction: Intravenous  PONV Risk Score and Plan: TIVA, Midazolam and Treatment may vary due to age or medical condition  Airway Management Planned: Natural Airway and Nasal Cannula  Additional Equipment:   Intra-op Plan:   Post-operative Plan:   Informed Consent: I have reviewed the patients History and Physical, chart, labs and discussed the procedure including the risks, benefits and alternatives for the proposed anesthesia with the patient or authorized representative who has indicated his/her understanding and acceptance.       Plan Discussed with: CRNA  Anesthesia Plan Comments:         Anesthesia Quick Evaluation

## 2021-08-29 NOTE — Transfer of Care (Signed)
Immediate Anesthesia Transfer of Care Note  Patient: Amber Farrell  Procedure(s) Performed: CATARACT EXTRACTION PHACO AND INTRAOCULAR LENS PLACEMENT (IOC) RIGHT 3.33 00.40.3  (Right: Eye)  Patient Location: PACU  Anesthesia Type: MAC  Level of Consciousness: awake, alert  and patient cooperative  Airway and Oxygen Therapy: Patient Spontanous Breathing and Patient connected to supplemental oxygen  Post-op Assessment: Post-op Vital signs reviewed, Patient's Cardiovascular Status Stable, Respiratory Function Stable, Patent Airway and No signs of Nausea or vomiting  Post-op Vital Signs: Reviewed and stable  Complications: No notable events documented.

## 2021-08-29 NOTE — Op Note (Signed)
  LOCATION:  [place]   PREOPERATIVE DIAGNOSIS:    Nuclear sclerotic cataract right eye. H25.11   POSTOPERATIVE DIAGNOSIS:  Nuclear sclerotic cataract right eye.     PROCEDURE:  Phacoemusification with posterior chamber intraocular lens placement of the right eye   ULTRASOUND TIME: Procedure(s): CATARACT EXTRACTION PHACO AND INTRAOCULAR LENS PLACEMENT (IOC) RIGHT 3.33 00.40.3  (Right)  LENS:   Implant Name Type Inv. Item Serial No. Manufacturer Lot No. LRB No. Used Action  LENS IOL TECNIS EYHANCE 22.5 - [id number] Intraocular Lens LENS IOL TECNIS EYHANCE 22.5 [id number]   Right 1 Implanted         SURGEON:  Wyonia Hough, MD   ANESTHESIA:  Topical with tetracaine drops and 2% Xylocaine jelly, augmented with 1% preservative-free intracameral lidocaine.    COMPLICATIONS:  None.   DESCRIPTION OF PROCEDURE:  The patient was identified in the holding room and transported to the operating room and placed in the supine position under the operating microscope.  The right eye was identified as the operative eye and it was prepped and draped in the usual sterile ophthalmic fashion.   A 1 millimeter clear-corneal paracentesis was made at the 12:00 position.  0.5 ml of preservative-free 1% lidocaine was injected into the anterior chamber. The anterior chamber was filled with Viscoat viscoelastic.  A 2.4 millimeter keratome was used to make a near-clear corneal incision at the 9:00 position.  A curvilinear capsulorrhexis was made with a cystotome and capsulorrhexis forceps.  Balanced salt solution was used to hydrodissect and hydrodelineate the nucleus.   Phacoemulsification was then used in stop and chop fashion to remove the lens nucleus and epinucleus.  The remaining cortex was then removed using the irrigation and aspiration handpiece. Provisc was then placed into the capsular bag to distend it for lens placement.  A lens was then injected into the capsular bag.   The remaining viscoelastic was aspirated.   Wounds were hydrated with balanced salt solution.  The anterior chamber was inflated to a physiologic pressure with balanced salt solution.  No wound leaks were noted. Cefuroxime 0.1 ml of a 10mg /ml solution was injected into the anterior chamber for a dose of 1 mg of intracameral antibiotic at the completion of the case.   Timolol and Brimonidine drops were applied to the eye.  The patient was taken to the recovery room in stable condition without complications of anesthesia or surgery.   Faaris Arizpe 08/29/2021, 9:45 AM \

## 2021-08-29 NOTE — Anesthesia Postprocedure Evaluation (Signed)
Anesthesia Post Note  Patient: Amber Farrell  Procedure(s) Performed: CATARACT EXTRACTION PHACO AND INTRAOCULAR LENS PLACEMENT (IOC) RIGHT 3.33 00.40.3  (Right: Eye)     Patient location during evaluation: PACU Anesthesia Type: MAC Level of consciousness: awake Pain management: pain level controlled Vital Signs Assessment: post-procedure vital signs reviewed and stable Respiratory status: respiratory function stable Cardiovascular status: stable Postop Assessment: no apparent nausea or vomiting Anesthetic complications: no   No notable events documented.  Veda Canning

## 2021-08-31 ENCOUNTER — Encounter: Payer: Self-pay | Admitting: Ophthalmology

## 2021-12-27 ENCOUNTER — Other Ambulatory Visit: Payer: Self-pay | Admitting: Internal Medicine

## 2021-12-27 DIAGNOSIS — Z1231 Encounter for screening mammogram for malignant neoplasm of breast: Secondary | ICD-10-CM

## 2022-01-31 ENCOUNTER — Ambulatory Visit
Admission: RE | Admit: 2022-01-31 | Discharge: 2022-01-31 | Disposition: A | Discharge status: Home / Self Care | Payer: Medicare PPO | Source: Ambulatory Visit | Attending: Internal Medicine | Admitting: Internal Medicine

## 2022-01-31 DIAGNOSIS — Z1231 Encounter for screening mammogram for malignant neoplasm of breast: Secondary | ICD-10-CM | POA: Insufficient documentation

## 2022-02-01 ENCOUNTER — Encounter: Payer: Self-pay | Admitting: Gastroenterology

## 2022-02-03 ENCOUNTER — Encounter: Payer: Self-pay | Admitting: Gastroenterology

## 2022-02-03 NOTE — H&P (Signed)
? ?Pre-Procedure H&P ?  ?Patient ID: Amber Farrell is a 76 y.o. female. ? ?Gastroenterology Provider: Annamaria Helling, DO ? ?Referring Provider: Stephens November, NP ?PCP: Idelle Crouch, MD ? ?Date: 02/04/2022 ? ?HPI ?Ms. Amber Farrell is a 76 y.o. female who presents today for Esophagogastroduodenoscopy and Colonoscopy for Lynch syndrome surveillance. 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 Brother with h/o espohageal cancer and multiple other malignancies in the hx per chart review. ?BM have been normal w/o melena, hematocheiza, diarrhea, or constipation. No UGI sx. ? ?01/2021- egd- normal- gastric bx negative for h pylori, malignancy, dysplasia. No GIM ?4/[facility]- nml colon ?02/2018- [facility] normal colon ?01/2017- [facility]- EGD- negative BE ?01/2017- [facility] - 42m cecal polyp ?12/2012- [facility]- nml TI, diverticulosis ? ?Last reported labs ferritin 17 tibc 480 iron sat 17%; hgb 13 mcv 91.7 plt 342 ? ?S/p L Hip replacement ? ?Past Medical History:  ?Diagnosis Date  ? Anemia   ? Arthritis   ? Cancer ([facility]   ? uterine  ? GERD (gastroesophageal reflux disease)   ? History of hiatal hernia   ? Hyperlipidemia, mixed   ? Hypertension   ? ? ?Past Surgical History:  ?Procedure Laterality Date  ? ABDOMINAL HYSTERECTOMY    ? APPENDECTOMY    ? at time of tubal  ? CATARACT EXTRACTION Left   ? CATARACT EXTRACTION W/PHACO Right 08/29/2021  ? Procedure: CATARACT EXTRACTION PHACO AND INTRAOCULAR LENS PLACEMENT (IOC) RIGHT 3.33 00.40.3 ;  Surgeon: BLeandrew Koyanagi MD;  [facility] [employer]  Service: Ophthalmology;  Laterality: Right;  ? COLONOSCOPY    ? ESOPHAGOGASTRODUODENOSCOPY    ? ESOPHAGOGASTRODUODENOSCOPY (EGD) WITH PROPOFOL N/A 04/10/2021  ? Procedure: ESOPHAGOGASTRODUODENOSCOPY (EGD) WITH PROPOFOL;  Surgeon: LLesly Rubenstein MD;  Location: [facility] ENDOSCOPY;  Service: Endoscopy;  Laterality: N/A;  OFFICE HAS NOTE: "NEEDS ANTIBIOTICS PRIOR TO PROCEDURE"  ?  OOPHORECTOMY    ? TOTAL HIP ARTHROPLASTY Left 09/27/2020  ? Procedure: TOTAL HIP ARTHROPLASTY ANTERIOR APPROACH;  Surgeon: BLovell Sheehan MD;  Location: [facility] ORS;  Service: Orthopedics;  Laterality: Left;  ? TUBAL LIGATION    ? ? ?Family History ?Brother- esophageal cancer ?Multiple other malignancies in the family (non GI) ?No other h/o GI disease or malignancy ? ?Review of Systems  ?Constitutional:  Negative for activity change, appetite change, chills, diaphoresis, fatigue, fever and unexpected weight change.  ?HENT:  Negative for trouble swallowing and voice change.   ?Respiratory:  Negative for shortness of breath and wheezing.   ?Cardiovascular:  Negative for chest pain, palpitations and leg swelling.  ?Gastrointestinal:  Negative for abdominal distention, abdominal pain, anal bleeding, blood in stool, constipation, diarrhea, nausea, rectal pain and vomiting.  ?Musculoskeletal:  Negative for arthralgias and myalgias.  ?Skin:  Negative for color change and pallor.  ?Neurological:  Negative for dizziness, syncope and weakness.  ?Psychiatric/Behavioral:  Negative for confusion.   ?All other systems reviewed and are negative.  ? ?Medications ?No current facility-administered medications on file prior to encounter.  ? ?Current Outpatient Medications on File Prior to Encounter  ?Medication Sig Dispense Refill  ? amLODipine (NORVASC) 2.5 MG tablet Take 2.5 mg by mouth every evening.    ? loratadine (CLARITIN) 10 MG tablet Take 10 mg by mouth daily.    ? omeprazole (PRILOSEC) 20 MG capsule Take 20 mg by mouth every evening.     ? acetaminophen (TYLENOL) 650 MG CR tablet Take 1,300 mg by mouth every 8 (eight) hours as  needed for pain. (Patient not taking: Reported on 08/09/2021)    ? acyclovir (ZOVIRAX) 400 MG tablet Take 400 mg by mouth 5 (five) times daily. (Patient not taking: Reported on 08/09/2021)    ? aspirin 81 MG chewable tablet Chew 1 tablet (81 mg total) by mouth 2 (two) times daily. 60 tablet 0  ? CALCIUM  CARBONATE-VIT D-MIN PO Take by mouth. (Patient not taking: Reported on 08/09/2021)    ? Calcium-Magnesium (CAL-MAG PO) Take 2 tablets by mouth daily. With potassium    ? Carboxymethylcellul-Glycerin (LUBRICATING EYE DROPS OP) Place 1 drop into both eyes daily.    ? cholecalciferol (VITAMIN D3) 25 MCG (1000 UNIT) tablet Take 1,000 Units by mouth daily.    ? clobetasol cream (TEMOVATE) 1.61 % Apply 1 application topically 2 (two) times daily as needed (itching).    ? docusate sodium (COLACE) 100 MG capsule Take 1 capsule (100 mg total) by mouth 2 (two) times daily. 10 capsule 0  ? ergocalciferol (VITAMIN D2) 1.25 MG (50000 UT) capsule Take 50,000 Units by mouth once a week. (Patient not taking: Reported on 08/09/2021)    ? Ferrous Gluconate 256 (28 Fe) MG TABS Take 56 mg by mouth daily with lunch.     ? HYDROcodone-acetaminophen (NORCO) 7.5-325 MG tablet Take 1 tablet by mouth every 4 (four) hours as needed for severe pain (pain score 7-10). (Patient not taking: Reported on 08/09/2021) 30 tablet 0  ? losartan-hydrochlorothiazide (HYZAAR) 100-25 MG tablet Take 1 tablet by mouth daily.    ? methocarbamol (ROBAXIN) 500 MG tablet Take 1 tablet (500 mg total) by mouth every 6 (six) hours as needed for muscle spasms. (Patient not taking: Reported on 08/09/2021) 40 tablet 0  ? ondansetron (ZOFRAN) 4 MG tablet Take 1 tablet (4 mg total) by mouth every 6 (six) hours as needed for nausea. (Patient not taking: Reported on 08/09/2021) 20 tablet 0  ? OVER THE COUNTER MEDICATION Take 4 tablets by mouth daily. Relief factor otc supplement    ? Quercetin 250 MG TABS Take by mouth daily.    ? ? ?Pertinent medications related to GI and procedure were reviewed by me with the patient prior to the procedure ? ? ?Current Facility-Administered Medications:  ?  0.9 %  sodium chloride infusion, , Intravenous, Continuous, Annamaria Helling, DO ?  ?  ? ?No Known Allergies ?Allergies were reviewed by me prior to the procedure ? ?Objective   ? ?Body mass index is 28.67 kg/m?. ?Vitals:  ? 02/04/22 1154  ?BP: (!) 139/100  ?Pulse: 77  ?Resp: 18  ?Temp: (!) 97.3 ?F (36.3 ?C)  ?TempSrc: Temporal  ?SpO2: 99%  ?Weight: 69.4 kg  ?Height: 5' 1.25" (1.556 m)  ? ? ? ?Physical Exam ?Vitals and nursing note reviewed.  ?Constitutional:   ?   General: She is not in acute distress. ?   Appearance: Normal appearance. She is not ill-appearing, toxic-appearing or diaphoretic.  ?HENT:  ?   Head: Normocephalic and atraumatic.  ?   Nose: Nose normal.  ?   Mouth/Throat:  ?   Mouth: Mucous membranes are moist.  ?   Pharynx: Oropharynx is clear.  ?Eyes:  ?   General: No scleral icterus. ?   Extraocular Movements: Extraocular movements intact.  ?Cardiovascular:  ?   Rate and Rhythm: Normal rate and regular rhythm.  ?   Heart sounds: Normal heart sounds. No murmur heard. ?  No friction rub. No gallop.  ?Pulmonary:  ?   Effort: Pulmonary  effort is normal. No respiratory distress.  ?   Breath sounds: Normal breath sounds. No wheezing, rhonchi or rales.  ?Abdominal:  ?   General: Abdomen is flat. Bowel sounds are normal. There is no distension.  ?   Palpations: Abdomen is soft.  ?   Tenderness: There is no abdominal tenderness. There is no guarding or rebound.  ?Musculoskeletal:  ?   Cervical back: Neck supple.  ?   Right lower leg: No edema.  ?   Left lower leg: No edema.  ?Skin: ?   General: Skin is warm and dry.  ?   Coloration: Skin is not jaundiced or pale.  ?Neurological:  ?   General: No focal deficit present.  ?   Mental Status: She is alert and oriented to person, place, and time. Mental status is at baseline.  ?Psychiatric:     ?   Mood and Affect: Mood normal.     ?   Behavior: Behavior normal.     ?   Thought Content: Thought content normal.     ?   Judgment: Judgment normal.  ? ? ? ?Assessment:  ?Ms. Amber Farrell is a 76 y.o. female  who presents today for Esophagogastroduodenoscopy and Colonoscopy for Lynch syndrome surveillance. ? ?Plan:  ?Esophagogastroduodenoscopy  and Colonoscopy with possible intervention today ? ?Esophagogastroduodenoscopy and Colonoscopy with possible biopsy, control of bleeding, polypectomy, and interventions as necessary has been discussed with the

## 2022-02-04 ENCOUNTER — Ambulatory Visit: Payer: Medicare PPO | Admitting: Certified Registered Nurse Anesthetist

## 2022-02-04 ENCOUNTER — Ambulatory Visit
Admission: RE | Admit: 2022-02-04 | Discharge: 2022-02-04 | Disposition: A | Discharge status: Home / Self Care | Payer: Medicare PPO | Attending: Gastroenterology | Admitting: Gastroenterology

## 2022-02-04 ENCOUNTER — Encounter
Admission: RE | Disposition: A | Discharge status: Home / Self Care | Payer: Self-pay | Source: Home / Self Care | Attending: Gastroenterology

## 2022-02-04 ENCOUNTER — Encounter: Payer: Self-pay | Admitting: Gastroenterology

## 2022-02-04 DIAGNOSIS — I10 Essential (primary) hypertension: Secondary | ICD-10-CM | POA: Insufficient documentation

## 2022-02-04 DIAGNOSIS — Z90710 Acquired absence of both cervix and uterus: Secondary | ICD-10-CM | POA: Insufficient documentation

## 2022-02-04 DIAGNOSIS — C19 Malignant neoplasm of rectosigmoid junction: Secondary | ICD-10-CM | POA: Diagnosis present

## 2022-02-04 DIAGNOSIS — K219 Gastro-esophageal reflux disease without esophagitis: Secondary | ICD-10-CM | POA: Insufficient documentation

## 2022-02-04 DIAGNOSIS — K5730 Diverticulosis of large intestine without perforation or abscess without bleeding: Secondary | ICD-10-CM | POA: Insufficient documentation

## 2022-02-04 DIAGNOSIS — K449 Diaphragmatic hernia without obstruction or gangrene: Secondary | ICD-10-CM | POA: Insufficient documentation

## 2022-02-04 DIAGNOSIS — Z87891 Personal history of nicotine dependence: Secondary | ICD-10-CM | POA: Diagnosis not present

## 2022-02-04 DIAGNOSIS — Z8542 Personal history of malignant neoplasm of other parts of uterus: Secondary | ICD-10-CM | POA: Insufficient documentation

## 2022-02-04 DIAGNOSIS — Z1509 Genetic susceptibility to other malignant neoplasm: Secondary | ICD-10-CM | POA: Diagnosis present

## 2022-02-04 DIAGNOSIS — Q402 Other specified congenital malformations of stomach: Secondary | ICD-10-CM | POA: Insufficient documentation

## 2022-02-04 DIAGNOSIS — K259 Gastric ulcer, unspecified as acute or chronic, without hemorrhage or perforation: Secondary | ICD-10-CM | POA: Diagnosis not present

## 2022-02-04 HISTORY — PX: ESOPHAGOGASTRODUODENOSCOPY (EGD) WITH PROPOFOL: SHX5813

## 2022-02-04 HISTORY — PX: COLONOSCOPY WITH PROPOFOL: SHX5780

## 2022-02-04 SURGERY — COLONOSCOPY WITH PROPOFOL
Anesthesia: General

## 2022-02-04 MED ORDER — PROPOFOL 10 MG/ML IV BOLUS
INTRAVENOUS | Status: AC
  Filled 2022-02-04: qty 20

## 2022-02-04 MED ORDER — LIDOCAINE HCL (PF) 2 % IJ SOLN
INTRAMUSCULAR | Status: AC
Start: 2022-02-04 — End: ?
  Filled 2022-02-04: qty 5

## 2022-02-04 MED ORDER — PROPOFOL 500 MG/50ML IV EMUL
INTRAVENOUS | Status: DC | PRN
  Administered 2022-02-04: 120 ug/kg/min via INTRAVENOUS

## 2022-02-04 MED ORDER — EPHEDRINE 5 MG/ML INJ
INTRAVENOUS | Status: AC
Start: 2022-02-04 — End: ?
  Filled 2022-02-04: qty 5

## 2022-02-04 MED ORDER — EPHEDRINE SULFATE (PRESSORS) 50 MG/ML IJ SOLN
INTRAMUSCULAR | Status: DC | PRN
  Administered 2022-02-04: 10 mg via INTRAVENOUS

## 2022-02-04 MED ORDER — LIDOCAINE HCL (CARDIAC) PF 100 MG/5ML IV SOSY
PREFILLED_SYRINGE | INTRAVENOUS | Status: DC | PRN
  Administered 2022-02-04: 50 mg via INTRAVENOUS

## 2022-02-04 MED ORDER — PROPOFOL 10 MG/ML IV BOLUS
INTRAVENOUS | Status: DC | PRN
  Administered 2022-02-04: 60 mg via INTRAVENOUS
  Administered 2022-02-04 (×2): 20 mg via INTRAVENOUS

## 2022-02-04 MED ORDER — GLYCOPYRROLATE 0.2 MG/ML IJ SOLN
INTRAMUSCULAR | Status: DC | PRN
  Administered 2022-02-04: .1 mg via INTRAVENOUS

## 2022-02-04 MED ORDER — SODIUM CHLORIDE 0.9 % IV SOLN: INTRAVENOUS | Status: DC

## 2022-02-04 NOTE — Transfer of Care (Signed)
Immediate Anesthesia Transfer of Care Note ? ?Patient: Amber Farrell ? ?Procedure(s) Performed: COLONOSCOPY WITH PROPOFOL ?ESOPHAGOGASTRODUODENOSCOPY (EGD) WITH PROPOFOL ? ?Patient Location: Endoscopy Unit ? ?Anesthesia Type:General ? ?Level of Consciousness: drowsy ? ?Airway & Oxygen Therapy: Patient Spontanous Breathing ? ?Post-op Assessment: Report given to RN and Post -op Vital signs reviewed and stable ? ?Post vital signs: Reviewed and stable ? ?Last Vitals:  ?Vitals Value Taken Time  ?BP 136/71 02/04/22 1344  ?Temp 35.8 ?C 02/04/22 1344  ?Pulse 76 02/04/22 1344  ?Resp 21 02/04/22 1344  ?SpO2 99 % 02/04/22 1344  ? ? ?Last Pain:  ?Vitals:  ? 02/04/22 1154  ?TempSrc: Temporal  ?PainSc: 0-No pain  ?   ? ?  ? ?Complications: No notable events documented. ?

## 2022-02-04 NOTE — Op Note (Signed)
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 Doy Hutching, MD (Referring MD) ?Medicines:             Monitored Anesthesia Care ?Complications:         No immediate complications. Estimated blood loss:  ?                       Minimal. ?Procedure:             Pre-Anesthesia Assessment: ?                       - Prior to the procedure, a History and Physical was  ?                       performed, and patient medications and allergies were  ?                       reviewed. The patient is competent. The risks and  ?                       benefits of the procedure and the sedation options and  ?                       risks were discussed with the patient. All questions  ?                       were answered and informed consent was obtained.  ?                       Patient identification and proposed procedure were  ?                       verified by the physician, the nurse, the anesthetist  ?                       and the technician in the endoscopy suite. Mental  ?                       Status Examination: alert and oriented. Airway  ?                       Examination: normal oropharyngeal airway and neck  ?                       mobility. Respiratory Examination: clear to  ?  auscultation. CV Examination: RRR, no murmurs, no S3  ?                       or S4. Prophylactic Antibiotics: The patient does not  ?                       require prophylactic antibiotics. Prior  ?                        Anticoagulants: The patient has taken no previous  ?                       anticoagulant or antiplatelet agents. ASA Grade  ?                       Assessment: II - A patient with mild systemic disease.  ?                       After reviewing the risks and benefits, the patient  ?                       was deemed in satisfactory condition to undergo the  ?                       procedure. The anesthesia plan was to use monitored  ?                       anesthesia care (MAC). Immediately prior to  ?                       administration of medications, the patient was  ?                       re-assessed for adequacy to receive sedatives. The  ?                       heart rate, respiratory rate, oxygen saturations,  ?                       blood pressure, adequacy of pulmonary ventilation, and  ?                       response to care were monitored throughout the  ?                       procedure. The physical status of the patient was  ?                       re-assessed after the procedure. ?                       After obtaining informed consent, the endoscope was  ?                       passed under direct vision. Throughout the procedure,  ?                       the patient's blood pressure, pulse, and oxygen  ?  saturations were monitored continuously. The  ?                       Endosonoscope was introduced through the mouth, and  ?                       advanced to the second part of duodenum. The upper GI  ?                       endoscopy was accomplished without difficulty. The  ?                       patient tolerated the procedure well. ?Findings: ?     The duodenal bulb, first portion of the duodenum and second portion of  ?     the duodenum were normal. Estimated blood loss: none. ?     A medium-sized hiatal hernia with two Lysbeth Galas ulcers was found. Biopsies  ?     were taken with a cold forceps for histology. Estimated blood loss was  ?     minimal. ?     The exam of the  stomach was otherwise normal. ?     The Z-line was regular. Estimated blood loss: none. ?     Esophagogastric landmarks were identified: the gastroesophageal junction  ?     was found at 30 cm from the incisors. ?     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     the esophagus. Estimated blood loss: none. ?     The exam of the esophagus was otherwise normal. ?Impression:            - Normal duodenal bulb, first portion of the duodenum  ?                       and second portion of the duodenum. ?                       - Medium-sized hiatal hernia with two Cameron ulcers.  ?                       Biopsied. ?                       - Z-line regular. ?                       - Esophagogastric landmarks identified. ?                       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 ?Recommendation:        - Discharge patient to home. ?                       - Resume previous diet. ?                       - Continue present medications. ?                       -  Await pathology results. ?                       - Repeat upper endoscopy. ?                       - Return to GI office as previously scheduled. ?Procedure Code(s):     --- Professional --- ?                       [phone], Esophagogastroduodenoscopy, flexible,  ?                       transoral; with biopsy, single or multiple ?Diagnosis Code(s):     --- Professional --- ?                       K44.9, Diaphragmatic hernia without obstruction or  ?                       gangrene ?                       K25.9, Gastric ulcer, unspecified as acute or chronic,  ?                       without hemorrhage or perforation ?                       Q40.2, Other specified congenital malformations of  ?                       stomach ?                       C18.9, Malignant neoplasm of colon, unspecified ?                       Z15.09, Genetic susceptibility to other malignant  ?                        neoplasm ?CPT copyright 2019 [employer]. All rights reserved. ?The codes documented in this report are preliminary and upon coder review may  ?be revised to meet current compliance requirements. ?Attending Participation: ?     I personally performed the entire procedure. ?Volney American, DO ?Annamaria Helling DO, DO ?02/04/2022 1:09:47 PM ?This report has been signed electronically. ?Number of Addenda: 0 ?Note Initiated On: 02/04/2022 12:14 PM ?Estimated Blood Loss:  Estimated blood loss was minimal. ?     [facility] ?

## 2022-02-04 NOTE — Anesthesia Preprocedure Evaluation (Addendum)
Anesthesia Evaluation  ?Patient identified by MRN, date of birth, ID band ?Patient awake ? ? ? ?Reviewed: ?Allergy & Precautions, NPO status , Patient's Chart, lab work & pertinent test results ? ?History of Anesthesia Complications ?Negative for: history of anesthetic complications ? ?Airway ?Mallampati: IV ? ? ?Neck ROM: Full ? ? ? Dental ? ?(+) Upper Dentures, Lower Dentures ?  ?Pulmonary ?former smoker (quit 1989),  ?  ?Pulmonary exam normal ?breath sounds clear to auscultation ? ? ? ? ? ? Cardiovascular ?hypertension, Normal cardiovascular exam ?Rhythm:Regular Rate:Normal ? ?Echo 01/08/21:  ?NORMAL LEFT VENTRICULAR SYSTOLIC FUNCTION  ?NORMAL RIGHT VENTRICULAR SYSTOLIC FUNCTION  ?MILD VALVULAR REGURGITATION ?NO VALVULAR STENOSIS ?  ?Neuro/Psych ?negative neurological ROS ?   ? GI/Hepatic ?hiatal hernia, GERD  ,Lynch syndrome ?  ?Endo/Other  ?negative endocrine ROS ? Renal/GU ?negative Renal ROS  ? ?  ?Musculoskeletal ? ?(+) Arthritis ,  ? Abdominal ?  ?Peds ? Hematology ? ?(+) Blood dyscrasia, anemia ,   ?Anesthesia Other Findings ? ? Reproductive/Obstetrics ?Uterine CA ? ?  ? ? ? ? ? ? ? ? ? ? ? ? ? ?  ?  ? ? ? ? ? ? ? ?Anesthesia Physical ?Anesthesia Plan ? ?ASA: 2 ? ?Anesthesia Plan: General  ? ?Post-op Pain Management:   ? ?Induction: Intravenous ? ?PONV Risk Score and Plan: 3 and Propofol infusion, TIVA and Treatment may vary due to age or medical condition ? ?Airway Management Planned: Natural Airway ? ?Additional Equipment:  ? ?Intra-op Plan:  ? ?Post-operative Plan:  ? ?Informed Consent: I have reviewed the patients History and Physical, chart, labs and discussed the procedure including the risks, benefits and alternatives for the proposed anesthesia with the patient or authorized representative who has indicated his/her understanding and acceptance.  ? ? ? ? ? ?Plan Discussed with: CRNA ? ?Anesthesia Plan Comments: (LMA/GETA backup discussed.  Patient consented for risks  of anesthesia including but not limited to:  ?- adverse reactions to medications ?- damage to eyes, teeth, lips or other oral mucosa ?- nerve damage due to positioning  ?- sore throat or hoarseness ?- damage to heart, brain, nerves, lungs, other parts of body or loss of life ? ?Informed patient about role of CRNA in peri- and intra-operative care.  Patient voiced understanding.)  ? ? ? ? ? ? ?Anesthesia Quick Evaluation ? ?

## 2022-02-04 NOTE — Interval H&P Note (Signed)
History and Physical Interval Note: Preprocedure H&P from 02/04/22 ? was reviewed and there was no interval change after seeing and examining the patient.  Written consent was obtained from the patient after discussion of risks, benefits, and alternatives. Patient has consented to proceed with Esophagogastroduodenoscopy and Colonoscopy with possible intervention ? ? ?02/04/2022 ?12:19 PM ? ?Amber Farrell  has presented today for surgery, with the diagnosis of LYNCH SYNDROME(Z15.09,.  The various methods of treatment have been discussed with the patient and family. After consideration of risks, benefits and other options for treatment, the patient has consented to  Procedure(s): ?COLONOSCOPY WITH PROPOFOL (N/A) ?ESOPHAGOGASTRODUODENOSCOPY (EGD) WITH PROPOFOL (N/A) as a surgical intervention.  The patient's history has been reviewed, patient examined, no change in status, stable for surgery.  I have reviewed the patient's chart and labs.  Questions were answered to the patient's satisfaction.   ? ? ?Annamaria Helling ? ? ?

## 2022-02-04 NOTE — Op Note (Signed)
[facility] ?Gastroenterology ?Patient Name: Amber Farrell ?Procedure Date: 02/04/2022 12:14 PM ?MRN: [id number] ?Account #: [id number] ?Date of Birth: [birth year] ?Admit Type: Outpatient ?Age: 76 ?Room: [facility] ENDO ROOM 2 ?Gender: Female ?Note Status: Finalized ?Instrument Name: Colonoscope [id number] ?Procedure:             Colonoscopy ?Indications:           Lynch Syndrome ?Providers:             Annamaria Helling DO, DO ?Referring MD:          Leonie Douglas. Doy Hutching, MD (Referring MD) ?Medicines:             Monitored Anesthesia Care ?Complications:         No immediate complications. Estimated blood loss:  ?                       Minimal. ?Procedure:             Pre-Anesthesia Assessment: ?                       - Prior to the procedure, a History and Physical was  ?                       performed, and patient medications and allergies were  ?                       reviewed. The patient is competent. The risks and  ?                       benefits of the procedure and the sedation options and  ?                       risks were discussed with the patient. All questions  ?                       were answered and informed consent was obtained.  ?                       Patient identification and proposed procedure were  ?                       verified by the physician, the nurse, the anesthetist  ?                       and the technician in the endoscopy suite. Mental  ?                       Status Examination: alert and oriented. Airway  ?                       Examination: normal oropharyngeal airway and neck  ?                       mobility. Respiratory Examination: clear to  ?                       auscultation. CV Examination: RRR, no murmurs, no S3  ?  or S4. Prophylactic Antibiotics: The patient does not  ?                       require prophylactic antibiotics. Prior  ?                       Anticoagulants: The patient has taken no previous  ?                        anticoagulant or antiplatelet agents. ASA Grade  ?                       Assessment: II - A patient with mild systemic disease.  ?                       After reviewing the risks and benefits, the patient  ?                       was deemed in satisfactory condition to undergo the  ?                       procedure. The anesthesia plan was to use monitored  ?                       anesthesia care (MAC). Immediately prior to  ?                       administration of medications, the patient was  ?                       re-assessed for adequacy to receive sedatives. The  ?                       heart rate, respiratory rate, oxygen saturations,  ?                       blood pressure, adequacy of pulmonary ventilation, and  ?                       response to care were monitored throughout the  ?                       procedure. The physical status of the patient was  ?                       re-assessed after the procedure. ?                       After obtaining informed consent, the colonoscope was  ?                       passed under direct vision. Throughout the procedure,  ?                       the patient's blood pressure, pulse, and oxygen  ?                       saturations were monitored continuously. The  ?  Colonoscope was introduced through the anus and  ?                       advanced to the the cecum, identified by appendiceal  ?                       orifice and ileocecal valve. The colonoscopy was  ?                       performed without difficulty. The patient tolerated  ?                       the procedure well. The quality of the bowel  ?                       preparation was evaluated using the BBPS [facility] Bowel  ?                       Preparation Scale) with scores of: Right Colon = 3,  ?                       Transverse Colon = 3 and Left Colon = 3 (entire mucosa  ?                       seen well with no residual staining, small fragments  ?                        of stool or opaque liquid). The total BBPS score  ?                       equals 9. The ileocecal valve, appendiceal orifice,  ?                       and rectum were photographed. ?Findings: ?     The perianal and digital rectal examinations were normal. Pertinent  ?     negatives include normal sphincter tone. ?     Multiple small-mouthed diverticula were found in the entire colon. acute  ?     angulation at rectosigmoid, but able to be traversed with lavage and  ?     abdominal pressure Estimated blood loss: none. ?     A 3 to 5 mm polyp was found in the descending colon. The polyp was  ?     sessile. The polyp was removed with a cold snare. Resection and  ?     retrieval were complete. Estimated blood loss was minimal. ?     A 1 to 2 mm polyp was found in the ascending colon. The polyp was  ?     sessile. The polyp was removed with a cold biopsy forceps. Resection and  ?     retrieval were complete. Estimated blood loss was minimal. ?     The exam was otherwise without abnormality on direct and retroflexion  ?     views. ?Impression:            - Diverticulosis in the entire examined colon. ?                       - One 3 to 5 mm  polyp in the descending colon, removed  ?                       with a cold snare. Resected and retrieved. ?                       - One 1 to 2 mm polyp in the ascending colon, removed  ?                       with a cold biopsy forceps. Resected and retrieved. ?                       - The examination was otherwise normal on direct and  ?                       retroflexion views. ?Recommendation:        - Discharge patient to home. ?                       - Resume previous diet. ?                       - Continue present medications. ?                       - Await pathology results. ?                       - Repeat colonoscopy for surveillance based on  ?                       pathology results. ?                       - Return to referring physician as previously  ?                        scheduled. ?                       - The findings and recommendations were discussed with  ?                       the patient. ?Procedure Code(s):     --- Professional --- ?                       [phone], Colonoscopy, flexible; with removal of  ?                       tumor(s), polyp(s), or other lesion(s) by snare  ?                       technique ?                       45380, 59, Colonoscopy, flexible; with biopsy, single  ?                       or multiple ?Diagnosis Code(s):     --- Professional --- ?  K63.5, Polyp of colon ?                       Z15.09, Genetic susceptibility to other malignant  ?                       neoplasm ?                       K57.30, Diverticulosis of large intestine without  ?                       perforation or abscess without bleeding ?CPT copyright 2019 [employer]. All rights reserved. ?The codes documented in this report are preliminary and upon coder review may  ?be revised to meet current compliance requirements. ?Attending Participation: ?     I personally performed the entire procedure. ?Volney American, DO ?Annamaria Helling DO, DO ?02/04/2022 1:48:33 PM ?This report has been signed electronically. ?Number of Addenda: 0 ?Note Initiated On: 02/04/2022 12:14 PM ?Scope Withdrawal Time: 0 hours 17 minutes 13 seconds  ?Total Procedure Duration: 0 hours 29 minutes 52 seconds  ?Estimated Blood Loss:  Estimated blood loss was minimal. ?     [facility] ?

## 2022-02-04 NOTE — Anesthesia Procedure Notes (Signed)
Procedure Name: [place] ?Date/Time: 02/04/2022 12:58 PM ?Performed by: Tollie Eth, CRNA ?Pre-anesthesia Checklist: Patient identified, Emergency Drugs available, Suction available and Patient being monitored ?Patient Re-evaluated:Patient Re-evaluated prior to induction ?Oxygen Delivery Method: Nasal cannula ?Induction Type: IV induction ?Placement Confirmation: positive ETCO2 ? ? ? ? ?

## 2022-02-05 ENCOUNTER — Encounter: Payer: Self-pay | Admitting: Gastroenterology

## 2022-02-05 LAB — SURGICAL PATHOLOGY

## 2022-02-07 NOTE — Anesthesia Postprocedure Evaluation (Signed)
Anesthesia Post Note ? ?Patient: Amber Farrell ? ?Procedure(s) Performed: COLONOSCOPY WITH PROPOFOL ?ESOPHAGOGASTRODUODENOSCOPY (EGD) WITH PROPOFOL ? ?Patient location during evaluation: PACU ?Anesthesia Type: General ?Level of consciousness: awake and alert ?Pain management: pain level controlled ?Vital Signs Assessment: post-procedure vital signs reviewed and stable ?Respiratory status: spontaneous breathing, nonlabored ventilation, respiratory function stable and patient connected to nasal cannula oxygen ?Cardiovascular status: blood pressure returned to baseline and stable ?Postop Assessment: no apparent nausea or vomiting ?Anesthetic complications: no ? ? ?No notable events documented. ? ? ?Last Vitals:  ?Vitals:  ? 02/04/22 1354 02/04/22 1404  ?BP: 114/89 133/64  ?Pulse: 71 68  ?Resp: (!) 21 19  ?Temp:    ?SpO2: 98% 98%  ?  ?Last Pain:  ?Vitals:  ? 02/05/22 0751  ?TempSrc:   ?PainSc: 0-No pain  ? ? ?  ?  ?  ?  ?  ?  ? ?[name] ? ? ? ? ?

## 2022-03-19 ENCOUNTER — Other Ambulatory Visit: Payer: Self-pay | Admitting: Internal Medicine

## 2022-03-19 DIAGNOSIS — I712 Thoracic aortic aneurysm, without rupture, unspecified: Secondary | ICD-10-CM

## 2022-03-29 ENCOUNTER — Ambulatory Visit
Admission: RE | Admit: 2022-03-29 | Discharge: 2022-03-29 | Disposition: A | Discharge status: Home / Self Care | Payer: Medicare PPO | Source: Ambulatory Visit | Attending: Internal Medicine | Admitting: Internal Medicine

## 2022-03-29 DIAGNOSIS — I712 Thoracic aortic aneurysm, without rupture, unspecified: Secondary | ICD-10-CM

## 2022-03-29 MED ORDER — IOPAMIDOL (ISOVUE-370) INJECTION 76%
75.0000 mL | Freq: Once | INTRAVENOUS | Status: AC | PRN
  Administered 2022-03-29: 75 mL via INTRAVENOUS

## 2022-12-19 ENCOUNTER — Other Ambulatory Visit: Payer: Self-pay | Admitting: Internal Medicine

## 2022-12-19 DIAGNOSIS — Z1231 Encounter for screening mammogram for malignant neoplasm of breast: Secondary | ICD-10-CM

## 2023-02-04 ENCOUNTER — Ambulatory Visit
Admission: RE | Admit: 2023-02-04 | Discharge: 2023-02-04 | Disposition: A | Discharge status: Home / Self Care | Payer: Medicare PPO | Source: Ambulatory Visit | Attending: Internal Medicine | Admitting: Internal Medicine

## 2023-02-04 DIAGNOSIS — Z1231 Encounter for screening mammogram for malignant neoplasm of breast: Secondary | ICD-10-CM | POA: Insufficient documentation

## 2023-08-20 IMAGING — MG MM DIGITAL SCREENING BILAT W/ TOMO AND CAD
6 of 10 series · 6 of 30 positions shown · non-contrast
Comparison: Previous exam(s).

CLINICAL DATA: Screening.

EXAM:
DIGITAL SCREENING BILATERAL MAMMOGRAM WITH TOMOSYNTHESIS AND CAD
TECHNIQUE: Bilateral screening digital craniocaudal and mediolateral oblique
mammograms were obtained. Bilateral screening digital breast
tomosynthesis was performed. The images were evaluated with
computer-aided detection.

[R MLO synth-2D]
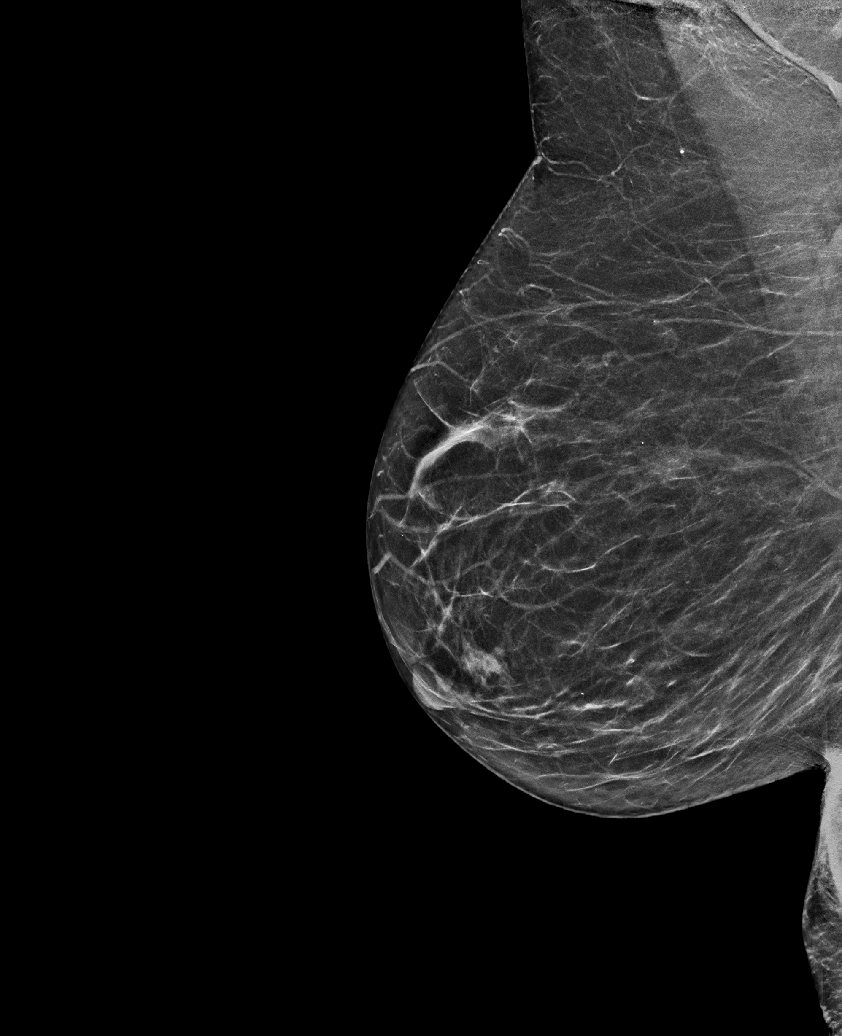

[L MLO synth-2D]
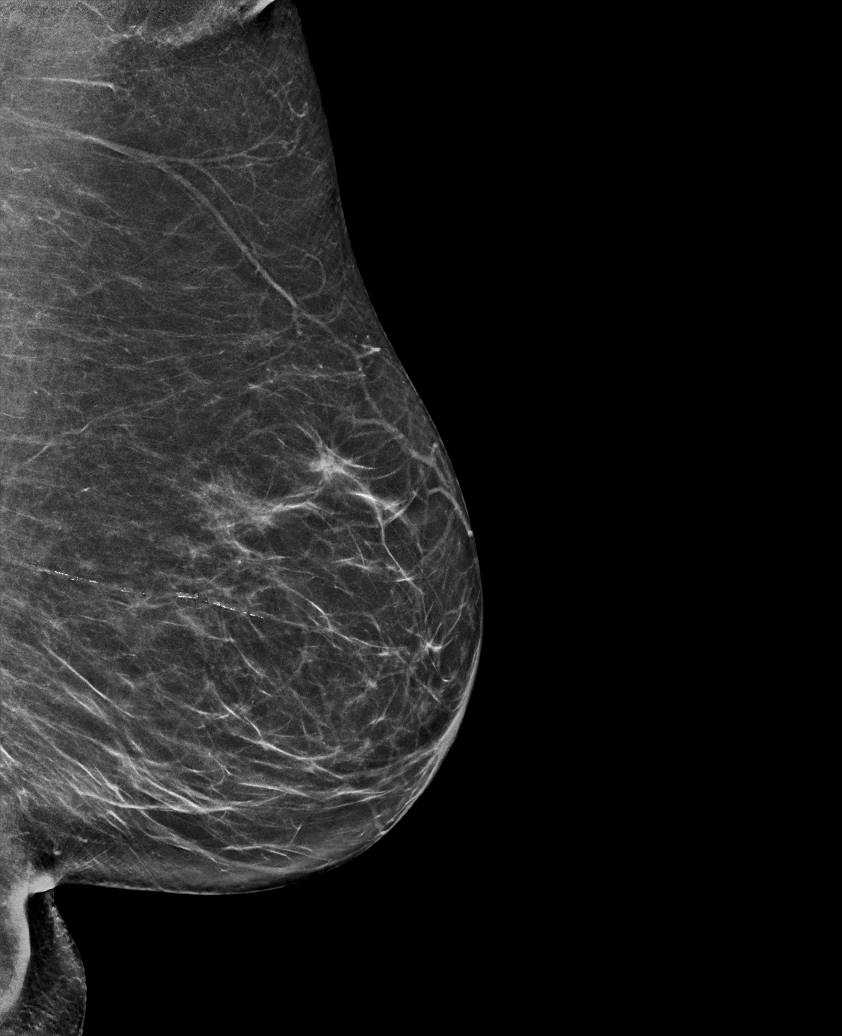

[R CC synth-2D (1 of 2)]
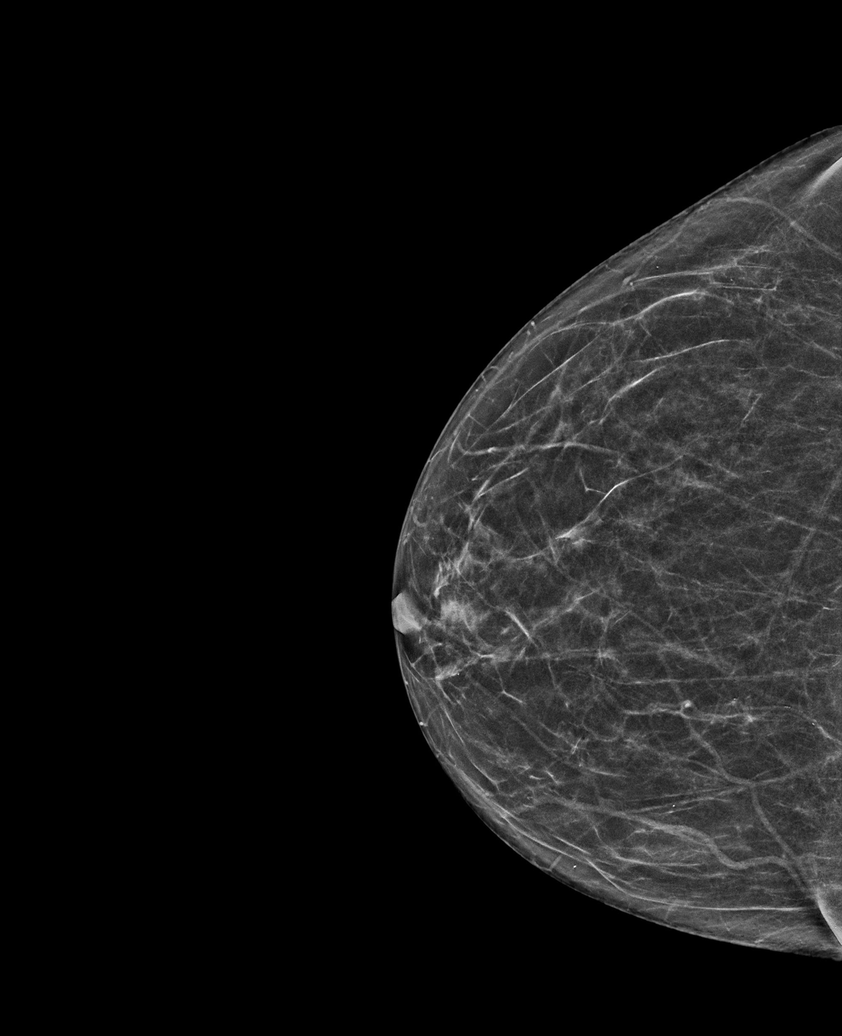

[R CC synth-2D (2 of 2)]
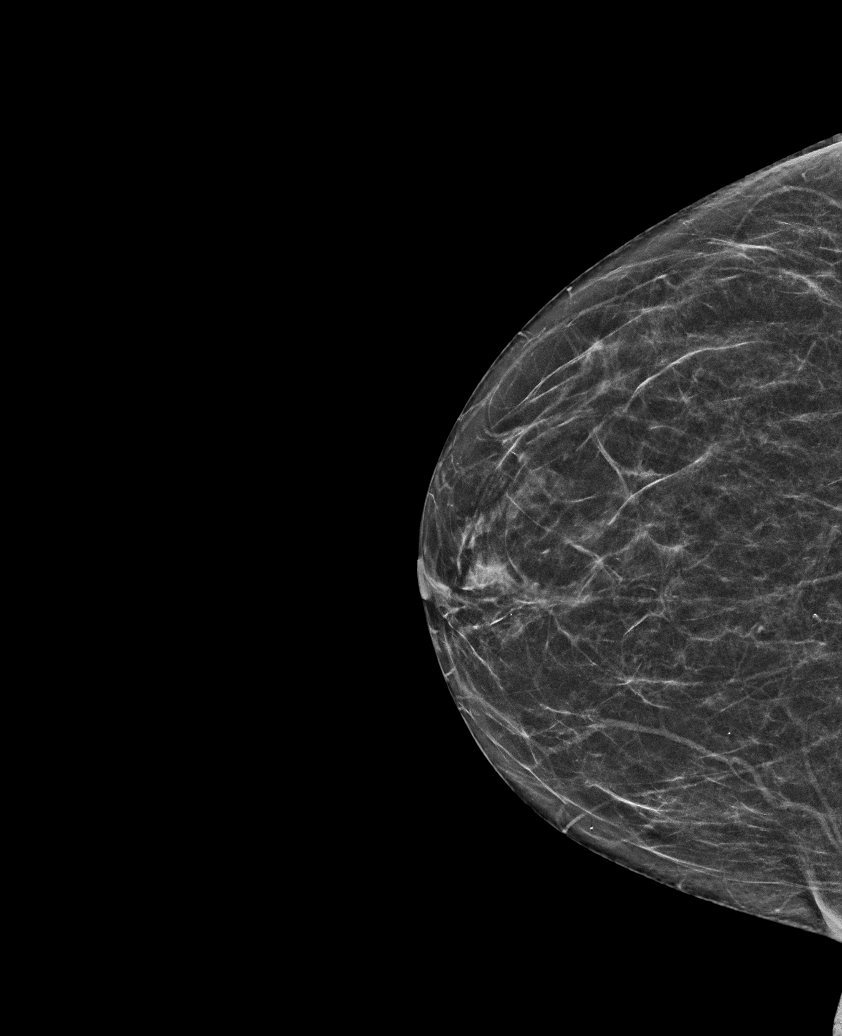

[L CC synth-2D]
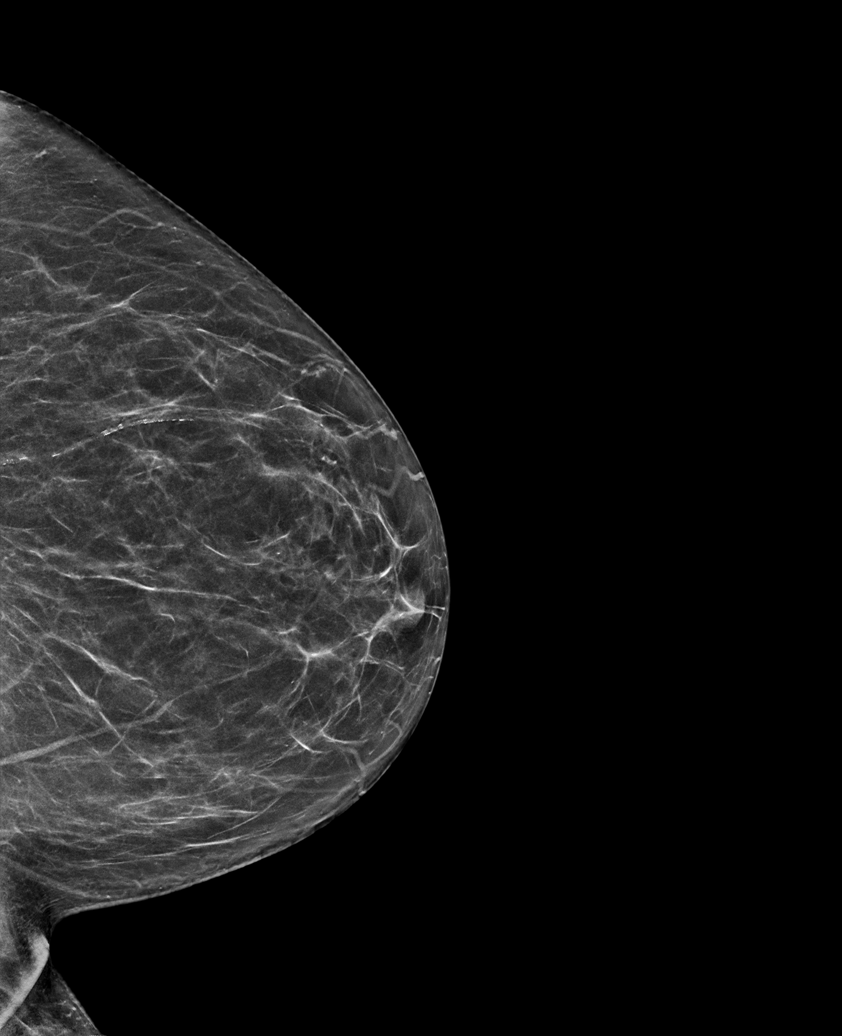

[R CC tomo · tomo slice 27/53.0]
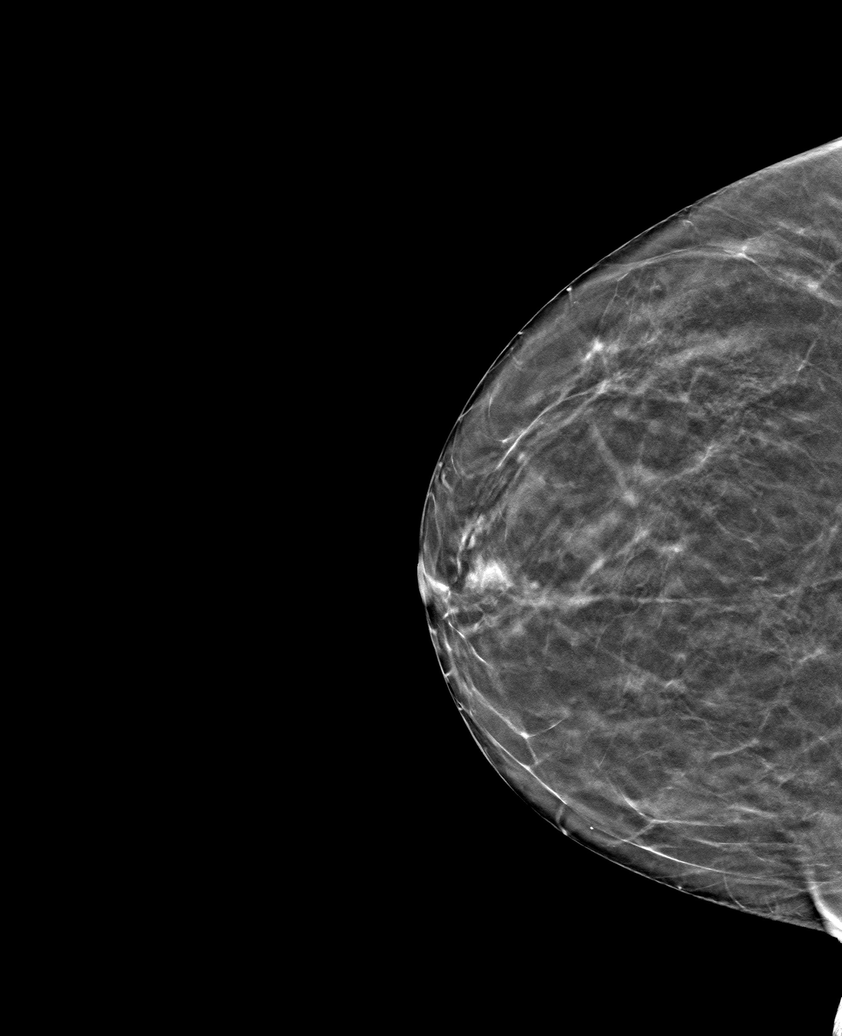

[6 of 30 positions shown; findings below may reference images not displayed]

ACR Breast Density Category b: There are scattered areas of
fibroglandular density.
FINDINGS: There are no findings suspicious for malignancy.
IMPRESSION: No mammographic evidence of malignancy. A result letter of this
screening mammogram will be mailed directly to the patient.

RECOMMENDATION:
Screening mammogram in one year. (Code:51-O-LD2)

BI-RADS CATEGORY  1: Negative.

## 2023-10-16 IMAGING — CT CT ANGIO CHEST
1 series · 18 of 32 positions shown · IV contrast (agent unspecified)
Comparison: Report for previous chest radiographs done on
03/18/2022. Images are not available for review at the time of this
report.

CLINICAL DATA: Possible thoracic aortic aneurysm seen in the
previous chest radiographs

EXAM:
CT ANGIOGRAPHY CHEST WITH CONTRAST
TECHNIQUE: Multidetector CT imaging of the chest was performed using the
standard protocol during bolus administration of intravenous
contrast. Multiplanar CT image reconstructions and MIPs were
obtained to evaluate the vascular anatomy.

[Series 4: thoracic cta 2mm · axial · 0.75mm/px · z∈[-308,-28]mm · 18 of 150 slices shown]
[im 5/150  lung]
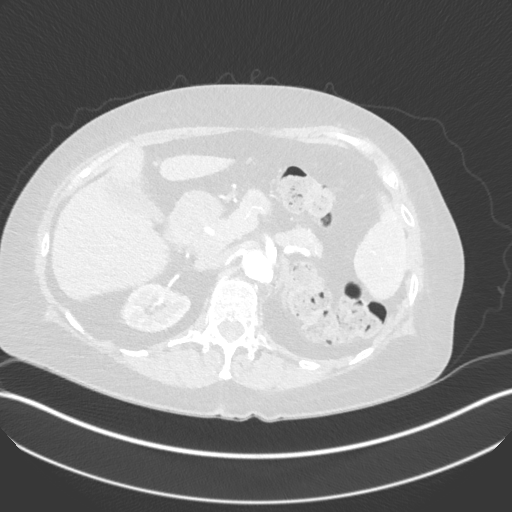
[im 15/150  soft-tissue]
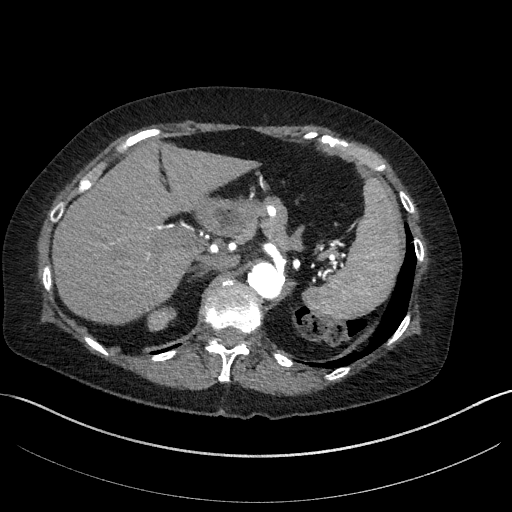
[im 25/150  lung]
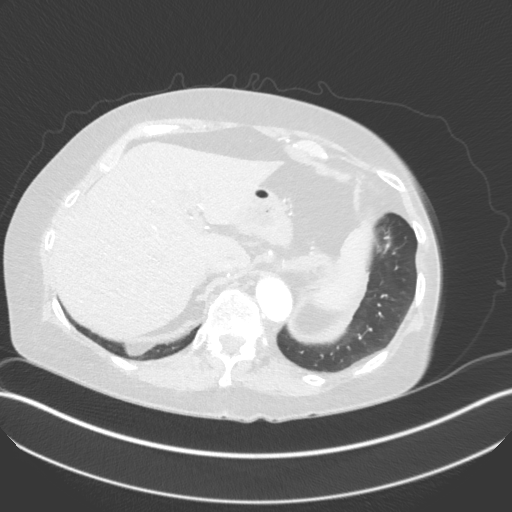
[im 29/150  soft-tissue]
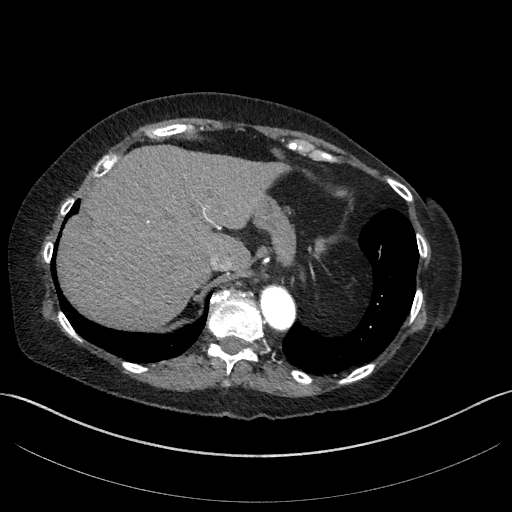
[im 39/150  lung]
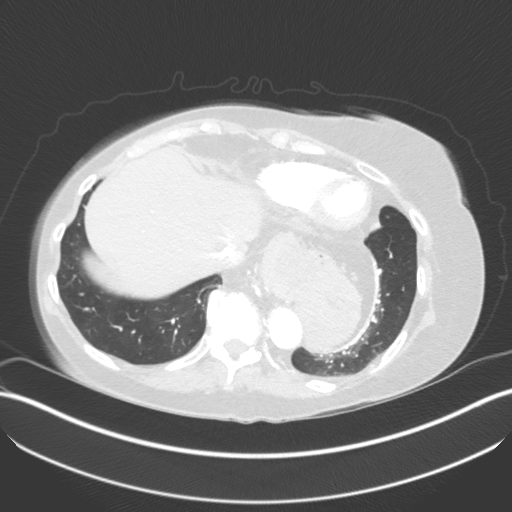
[im 49/150  soft-tissue]
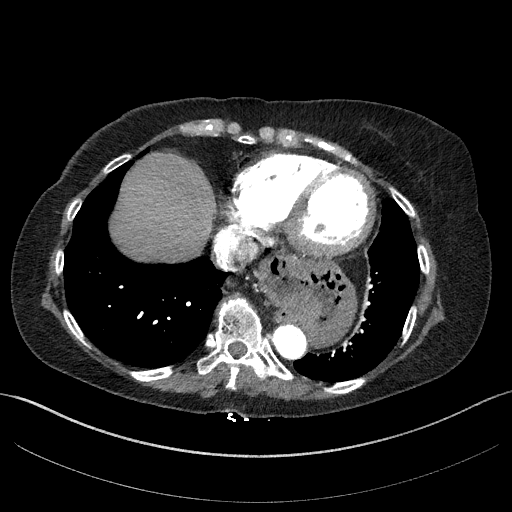
[im 53/150  lung]
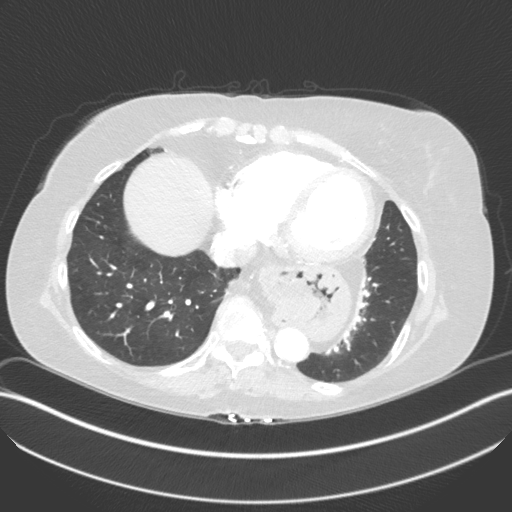
[im 63/150  soft-tissue]
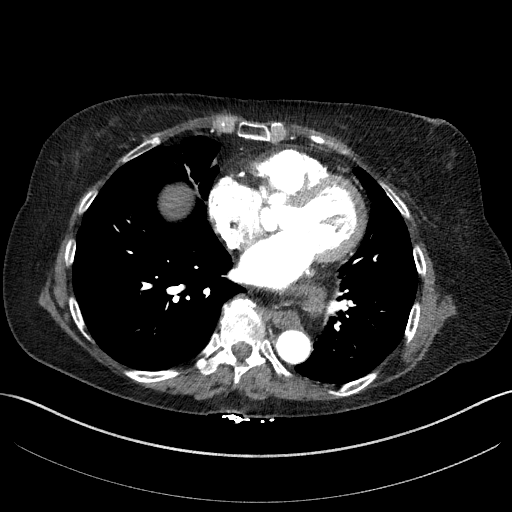
[im 73/150  lung]
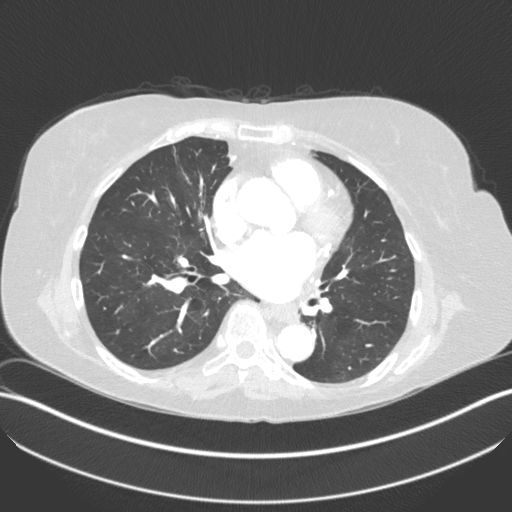
[im 77/150  soft-tissue]
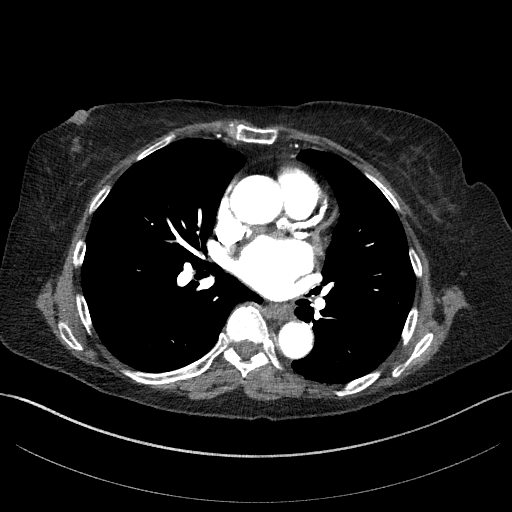
[im 87/150  lung]
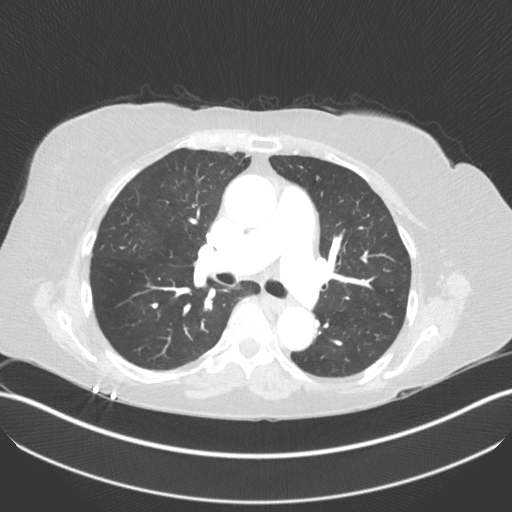
[im 97/150  soft-tissue]
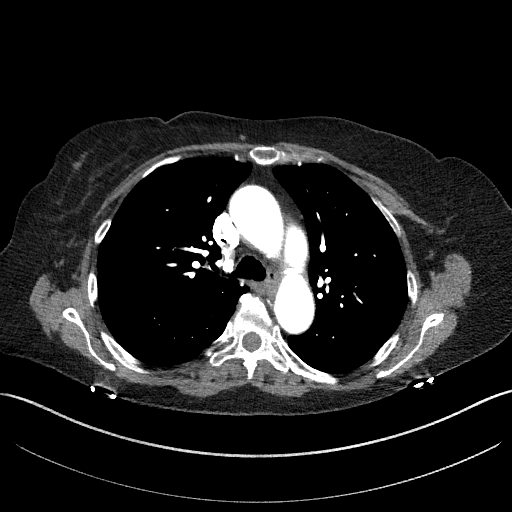
[im 101/150  lung]
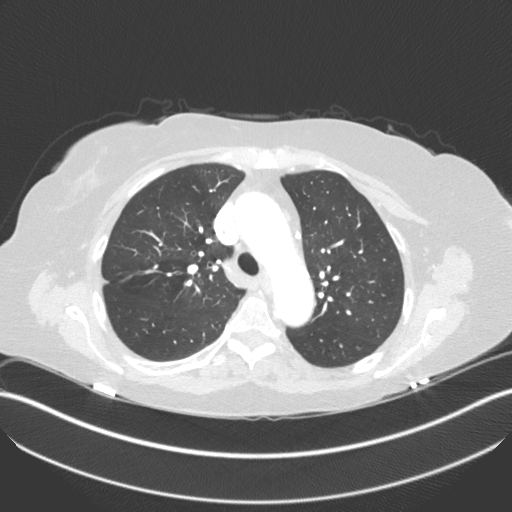
[im 111/150  soft-tissue]
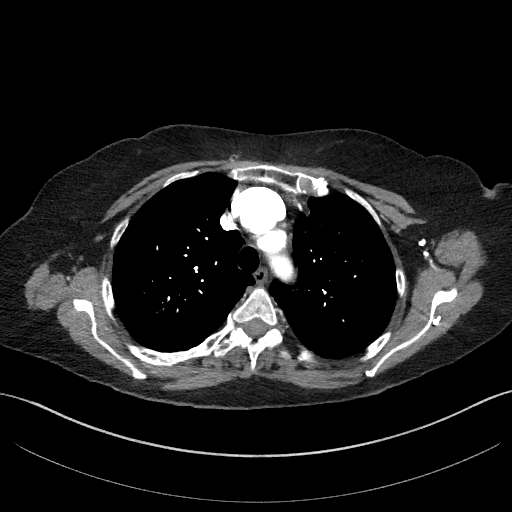
[im 121/150  lung]
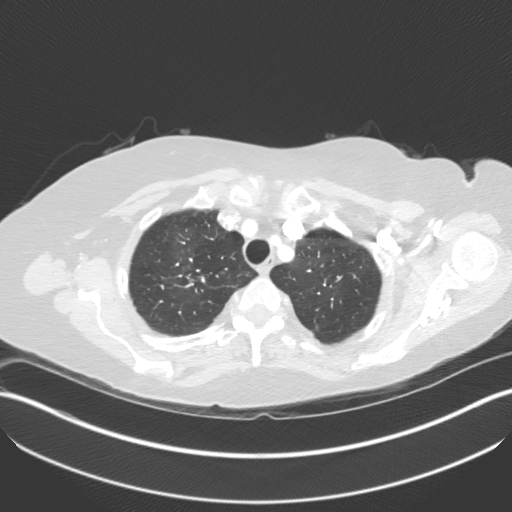
[im 125/150  soft-tissue]
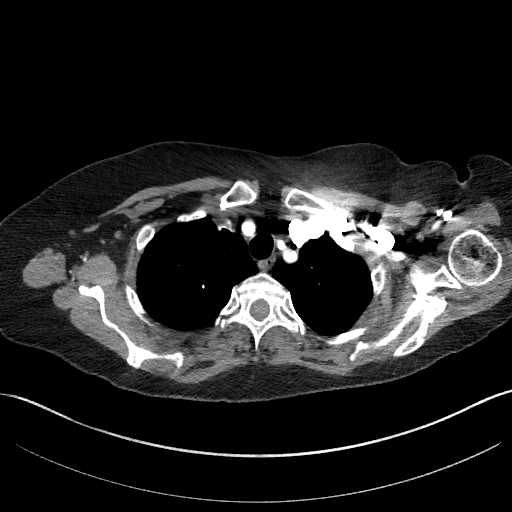
[im 135/150  lung]
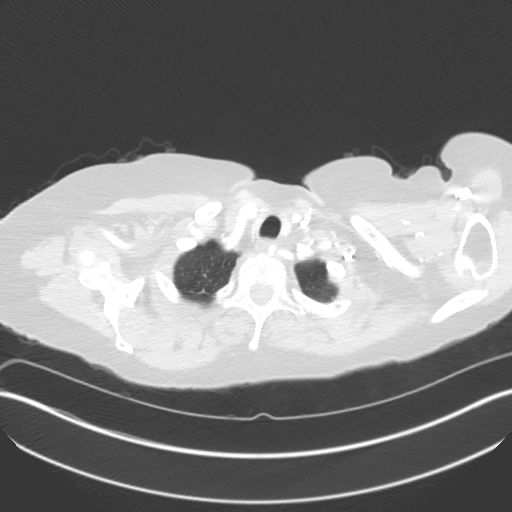
[im 145/150  soft-tissue]
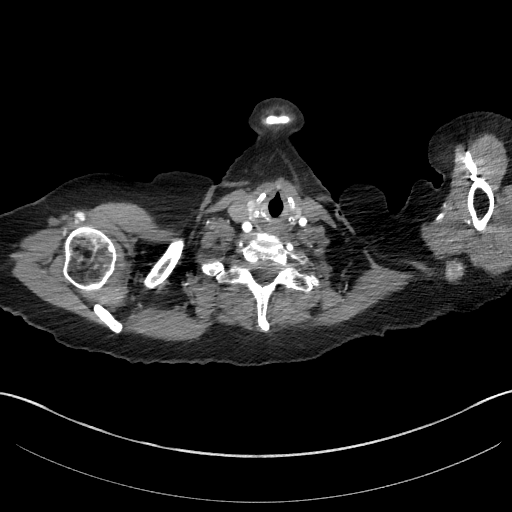

[18 of 32 positions shown; findings below may reference images not displayed]

RADIATION DOSE REDUCTION: This exam was performed according to the
departmental dose-optimization program which includes automated
exposure control, adjustment of the mA and/or kV according to
patient size and/or use of iterative reconstruction technique.

CONTRAST:  75mL H93TLR-22G IOPAMIDOL (H93TLR-22G) INJECTION 76%
FINDINGS: Cardiovascular: There is homogeneous enhancement in the thoracic
aorta. There is fusiform ectasia of ascending thoracic aorta
measuring up to 3.9 cm. Aortic arch measures 2.9 cm. Proximal
descending thoracic aorta measures 2.7 cm. Distal descending
thoracic aorta measures 2.5 cm. There are scattered calcifications
in the thoracic aorta and coronary arteries. Major branches of
thoracic aorta appear patent. There are no filling defects in the
central pulmonary artery branches.

Mediastinum/Nodes: No significant lymphadenopathy seen. There is
moderate to large fixed hiatal hernia. Ectasia of descending
thoracic aorta seen in the previous chest radiographs may have been
due to confluence of fixed hiatal hernia and descending thoracic
aorta.

Lungs/Pleura: Linear densities seen in the medial segment of right
middle lobe and in the left lower lobe. There is no focal
consolidation. There is no pleural effusion or pneumothorax.

Upper Abdomen: There is fatty infiltration in the liver.

Musculoskeletal: Unremarkable.

Review of the MIP images confirms the above findings.
IMPRESSION: There is no evidence of thoracic aortic dissection. There is no
evidence aneurysmal dilation of descending thoracic aorta. There is
fusiform ectasia of ascending thoracic aorta measuring 3.9 cm.
Recommend annual imaging followup by CTA or MRA. This recommendation
follows 9050 ACCF/AHA/AATS/ACR/ASA/SCA/MIRIZZI/BALASQUIDE/ANNEKE/CHAYANA Guidelines
for the Diagnosis and Management of Patients with Thoracic Aortic
Disease. Circulation. 9050; 121: E266-e369. Aortic aneurysm NOS
([id number])

There is no evidence of central pulmonary artery embolism. Coronary
artery calcifications are seen. There is no focal pulmonary
consolidation. There is no pleural effusion or pneumothorax.
Moderate to large fixed hiatal hernia. Fatty liver.

## 2023-12-23 ENCOUNTER — Other Ambulatory Visit: Payer: Self-pay | Admitting: Internal Medicine

## 2023-12-23 DIAGNOSIS — Z1231 Encounter for screening mammogram for malignant neoplasm of breast: Secondary | ICD-10-CM

## 2024-02-05 ENCOUNTER — Ambulatory Visit
Admission: RE | Admit: 2024-02-05 | Discharge: 2024-02-05 | Disposition: A | Discharge status: Home / Self Care | Payer: Medicare PPO | Source: Ambulatory Visit | Attending: Internal Medicine | Admitting: Internal Medicine

## 2024-02-05 DIAGNOSIS — Z1231 Encounter for screening mammogram for malignant neoplasm of breast: Secondary | ICD-10-CM | POA: Insufficient documentation

## 2024-03-09 ENCOUNTER — Ambulatory Visit

## 2024-03-09 DIAGNOSIS — K5730 Diverticulosis of large intestine without perforation or abscess without bleeding: Secondary | ICD-10-CM | POA: Diagnosis not present

## 2024-03-09 DIAGNOSIS — Z1509 Genetic susceptibility to other malignant neoplasm: Secondary | ICD-10-CM | POA: Diagnosis not present

## 2024-03-09 DIAGNOSIS — K2270 Barrett's esophagus without dysplasia: Secondary | ICD-10-CM | POA: Diagnosis not present

## 2024-03-09 DIAGNOSIS — Z1211 Encounter for screening for malignant neoplasm of colon: Secondary | ICD-10-CM | POA: Diagnosis not present

## 2024-03-09 DIAGNOSIS — K449 Diaphragmatic hernia without obstruction or gangrene: Secondary | ICD-10-CM | POA: Diagnosis not present

## 2024-03-09 DIAGNOSIS — D122 Benign neoplasm of ascending colon: Secondary | ICD-10-CM | POA: Diagnosis not present

## 2024-09-01 ENCOUNTER — Other Ambulatory Visit: Payer: Self-pay | Admitting: Internal Medicine

## 2024-09-01 DIAGNOSIS — E782 Mixed hyperlipidemia: Secondary | ICD-10-CM

## 2024-09-01 DIAGNOSIS — I10 Essential (primary) hypertension: Secondary | ICD-10-CM

## 2024-09-07 ENCOUNTER — Ambulatory Visit
Admission: RE | Admit: 2024-09-07 | Discharge: 2024-09-07 | Disposition: A | Discharge status: Home / Self Care | Payer: Self-pay | Source: Ambulatory Visit | Attending: Internal Medicine | Admitting: Internal Medicine

## 2024-09-07 DIAGNOSIS — I10 Essential (primary) hypertension: Secondary | ICD-10-CM | POA: Insufficient documentation

## 2024-09-07 DIAGNOSIS — E782 Mixed hyperlipidemia: Secondary | ICD-10-CM | POA: Insufficient documentation
